# Patient Record
Sex: Male | Born: 1975 | Race: White | Hispanic: No | Marital: Married | Smoking: Never smoker
Health system: Southern US, Community
[De-identification: ages and names within clinical notes are randomized; demographics above are authoritative.]

## PROBLEM LIST (undated history)

## (undated) DIAGNOSIS — F419 Anxiety disorder, unspecified: Secondary | ICD-10-CM

## (undated) DIAGNOSIS — J302 Other seasonal allergic rhinitis: Secondary | ICD-10-CM

## (undated) DIAGNOSIS — F329 Major depressive disorder, single episode, unspecified: Secondary | ICD-10-CM

## (undated) DIAGNOSIS — F32A Depression, unspecified: Secondary | ICD-10-CM

## (undated) DIAGNOSIS — T7840XA Allergy, unspecified, initial encounter: Secondary | ICD-10-CM

## (undated) HISTORY — PX: VASECTOMY: SHX75

## (undated) HISTORY — DX: Depression, unspecified: F32.A

## (undated) HISTORY — PX: CHOLECYSTECTOMY: SHX55

## (undated) HISTORY — DX: Allergy, unspecified, initial encounter: T78.40XA

## (undated) HISTORY — PX: FRACTURE SURGERY: SHX138

---
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
## 2010-07-09 ENCOUNTER — Encounter
Admission: RE | Admit: 2010-07-09 | Discharge: 2010-07-09 | Payer: Self-pay | Source: Home / Self Care | Attending: Family Medicine | Admitting: Family Medicine

## 2010-09-01 ENCOUNTER — Encounter: Payer: Self-pay | Admitting: Emergency Medicine

## 2010-09-01 ENCOUNTER — Ambulatory Visit (INDEPENDENT_AMBULATORY_CARE_PROVIDER_SITE_OTHER): Payer: BC Managed Care – PPO | Admitting: Emergency Medicine

## 2010-09-01 DIAGNOSIS — F419 Anxiety disorder, unspecified: Secondary | ICD-10-CM | POA: Insufficient documentation

## 2010-09-01 DIAGNOSIS — F411 Generalized anxiety disorder: Secondary | ICD-10-CM | POA: Insufficient documentation

## 2010-09-01 DIAGNOSIS — L500 Allergic urticaria: Secondary | ICD-10-CM | POA: Insufficient documentation

## 2010-09-01 DIAGNOSIS — J45909 Unspecified asthma, uncomplicated: Secondary | ICD-10-CM | POA: Insufficient documentation

## 2010-09-03 ENCOUNTER — Telehealth (INDEPENDENT_AMBULATORY_CARE_PROVIDER_SITE_OTHER): Payer: Self-pay | Admitting: *Deleted

## 2010-09-11 NOTE — Assessment & Plan Note (Signed)
Summary: RED RASH,ITCHING/WSE (rm 4)   Vital Signs:  Patient Profile:   35 Years Old Male CC:      rash, itching, fever  x 4 days Height:     68 inches Weight:      214 pounds O2 Sat:      100 % O2 treatment:    Room Air Temp:     99.0 degrees F oral Pulse rate:   91 / minute Resp:     14 per minute BP sitting:   118 / 76  (left arm) Cuff size:   large  Pt. in pain?   no  Vitals Entered By: Lajean Saver RN (September 01, 2010 6:00 PM)                   Updated Prior Medication List: ALBUTEROL SULFATE (2.5 MG/3ML) 0.083% NEBU (ALBUTEROL SULFATE) prn ZYRTEC ALLERGY 10 MG CAPS (CETIRIZINE HCL)  WELLBUTRIN XL 300 MG XR24H-TAB (BUPROPION HCL)   Current Allergies: ! PHENERGAN ! * SINGULAIR * SEASONALHistory of Present Illness Chief Complaint: rash, itching, fever  x 4 days History of Present Illness: Took phenergan for the first time 4 days ago (rx'd by his pcp for N/V for dx of G-E. GI signs's resolved. The next day, developed severe, worsening pruritic rash on arms, legs, chest, and back. Not relieved with by mouth benadryl or topical caladryl.   REVIEW OF SYSTEMS Constitutional Symptoms       Complains of fever.     Denies chills, night sweats, weight loss, weight gain, and fatigue.  Eyes       Denies change in vision, eye pain, eye discharge, glasses, contact lenses, and eye surgery. Ear/Nose/Throat/Mouth       Denies hearing loss/aids, change in hearing, ear pain, ear discharge, dizziness, frequent runny nose, frequent nose bleeds, sinus problems, sore throat, hoarseness, and tooth pain or bleeding.  Respiratory       Denies dry cough, productive cough, wheezing, shortness of breath, asthma, bronchitis, and emphysema/COPD.  Cardiovascular       Denies murmurs, chest pain, and tires easily with exhertion.    Gastrointestinal       Denies stomach pain, nausea/vomiting, diarrhea, constipation, blood in bowel movements, and indigestion. Genitourniary       Denies  painful urination, kidney stones, and loss of urinary control. Neurological       Denies paralysis, seizures, and fainting/blackouts. Musculoskeletal       Denies muscle pain, joint pain, joint stiffness, decreased range of motion, redness, swelling, muscle weakness, and gout.  Skin       Denies bruising, unusual mles/lumps or sores, and hair/skin or nail changes.      Comments: itching Psych       Denies mood changes, temper/anger issues, anxiety/stress, speech problems, depression, and sleep problems. Other Comments: Patient took Phenergan for the first time on Thursday. He took a nap and once he awoke he noticed a red rash developing on several places of his body. He didnot take anymore phenergan but the rash has worsened since. he has also had a fever and felt slightly short of breath. The rash is on his arms, legs, chest, and back   Past History:  Past Medical History: Anxiety Asthma  Past Surgical History: Cholecystectomy  Family History: none  Social History: Occupation: Radio Engineer, production Married Never Smoked Alcohol use-no Drug use-no childrenSmoking Status:  never Drug Use:  no Physical Exam General appearance: uncomfortable from pruritic rash, NAD Head: normocephalic, atraumatic  Eyes: conjunctivae and lids normal Pupils: equal, round, reactive to light Ears: normal, no lesions or deformities Oral/Pharynx: tongue normal, posterior pharynx without erythema or exudate. No lip swelling. Airway intact. Neck: neck supple,  trachea midline, no masses Chest/Lungs: no rales, wheezes, or rhonchi bilateral, breath sounds equal without effort Heart: regular rate and  rhythm, no murmur Neurological: grossly intact and non-focal Skin: diffuse, blanching, morbilliform, maculopap. rash on trunck, neck and extremities. Assessment New Problems: ALLERGIC URTICARIA (ICD-708.0) ASTHMA (ICD-493.90) ANXIETY (ICD-300.00)  I suspect phenergan as cause for urticaria. In my opinion, it is  severe enough to rx with by mouth steroids. --Pt agrees.  Risks, benefits, alternatives discussed. Pt voiced understanding and agreement.   Patient Education: Patient and/or caregiver instructed in the following: fluids. other sxx care discussed  Plan New Medications/Changes: PREDNISONE (PAK) 10 MG TABS (PREDNISONE) 6 day dosepack as directed  #1 x 0, 09/01/2010, Lajean Manes MD  New Orders: Est. Patient Level III [16109] New Patient Level III [60454] Follow Up: Follow up in 2-3 days if no improvement, Follow up with Primary Physician Follow Up: sooner if worse  The patient and/or caregiver has been counseled thoroughly with regard to medications prescribed including dosage, schedule, interactions, rationale for use, and possible side effects and they verbalize understanding.  Diagnoses and expected course of recovery discussed and will return if not improved as expected or if the condition worsens. Patient and/or caregiver verbalized understanding.  Prescriptions: PREDNISONE (PAK) 10 MG TABS (PREDNISONE) 6 day dosepack as directed  #1 x 0   Entered and Authorized by:   Lajean Manes MD   Signed by:   Lajean Manes MD on 09/01/2010   Method used:   Handwritten   RxID:   [id number]   Orders Added: 1)  Est. Patient Level III [30865] 2)  New Patient Level III [78469]

## 2010-09-11 NOTE — Progress Notes (Signed)
  Phone Note Outgoing Call Call back at [facility] Phone [phone]   Call placed by: Lajean Saver RN,  September 03, 2010 2:14 PM Call placed to: Patient Action Taken: Phone Call Completed Summary of Call: Callback: Patient reports his rash is improving and he is taking the presnisone as prescribed

## 2010-10-24 ENCOUNTER — Ambulatory Visit
Admission: RE | Admit: 2010-10-24 | Discharge: 2010-10-24 | Disposition: A | Discharge status: Home / Self Care | Payer: BC Managed Care – PPO | Source: Ambulatory Visit | Attending: Internal Medicine | Admitting: Internal Medicine

## 2010-10-24 ENCOUNTER — Other Ambulatory Visit: Payer: Self-pay | Admitting: Internal Medicine

## 2010-10-24 DIAGNOSIS — J40 Bronchitis, not specified as acute or chronic: Secondary | ICD-10-CM

## 2011-07-30 ENCOUNTER — Emergency Department (INDEPENDENT_AMBULATORY_CARE_PROVIDER_SITE_OTHER)
Admission: EM | Admit: 2011-07-30 | Discharge: 2011-07-30 | Disposition: A | Discharge status: Home / Self Care | Payer: BC Managed Care – PPO | Source: Home / Self Care | Attending: Emergency Medicine | Admitting: Emergency Medicine

## 2011-07-30 ENCOUNTER — Encounter: Payer: Self-pay | Admitting: Emergency Medicine

## 2011-07-30 DIAGNOSIS — J209 Acute bronchitis, unspecified: Secondary | ICD-10-CM

## 2011-07-30 DIAGNOSIS — R05 Cough: Secondary | ICD-10-CM

## 2011-07-30 MED ORDER — AZITHROMYCIN 250 MG PO TABS: ORAL_TABLET | ORAL | Status: AC

## 2011-07-30 MED ORDER — GUAIFENESIN-CODEINE 100-10 MG/5ML PO SYRP: 5.0000 mL | ORAL_SOLUTION | Freq: Four times a day (QID) | ORAL | Status: AC | PRN

## 2011-07-30 NOTE — ED Provider Notes (Signed)
History     CSN: [id number]  Arrival date & time 07/30/11  [place] Guardian   First MD Initiated Contact with Patient 07/30/11 1855      Chief Complaint  Patient presents with  . Cough    (Consider location/radiation/quality/duration/timing/severity/associated sxs/prior treatment) HPI Donald Gillespie is a 36 y.o. male who complains of onset of cold symptoms for 2 weeks.  No sore throat + productive cough No pleuritic pain + nasal congestion + post-nasal drainage + sinus pain/pressure + chest congestion No itchy/red eyes No earache No hemoptysis + SOB from coughing No chills/sweats No fever No nausea No vomiting No abdominal pain No diarrhea No skin rashes No fatigue No myalgias No headache    History reviewed. No pertinent past medical history.  Past Surgical History  Procedure Date  . Cholecystectomy     History reviewed. No pertinent family history.  History  Substance Use Topics  . Smoking status: Not on file  . Smokeless tobacco: Not on file  . Alcohol Use:       Review of Systems  Allergies  Montelukast sodium and Promethazine hcl  Home Medications   Current Outpatient Rx  Name Route Sig Dispense Refill  . ALBUTEROL SULFATE (2.5 MG/3ML) 0.083% IN NEBU Nebulization Take 2.5 mg by nebulization every 6 (six) hours as needed.      . BUPROPION HCL ER (XL) 300 MG PO TB24 Oral Take 300 mg by mouth daily.      Marland Kitchen CETIRIZINE HCL 10 MG PO TABS Oral Take 10 mg by mouth daily.      Marland Kitchen CLONAZEPAM 0.5 MG PO TABS Oral Take 0.5 mg by mouth 2 (two) times daily as needed.        BP 119/81  Pulse 96  Temp(Src) 98.6 F (37 C) (Oral)  Resp 16  Ht 5\' 8"  (1.727 m)  Wt 211 lb (95.709 kg)  BMI 32.08 kg/m2  SpO2 99%  Physical Exam  Nursing note and vitals reviewed. Constitutional: He is oriented to person, place, and time. He appears well-developed and well-nourished.  HENT:  Head: Normocephalic and atraumatic.  Right Ear: Tympanic membrane, external ear and ear canal normal.   Left Ear: Tympanic membrane, external ear and ear canal normal.  Nose: Mucosal edema and rhinorrhea present.  Mouth/Throat: Posterior oropharyngeal erythema present. No oropharyngeal exudate or posterior oropharyngeal edema.  Eyes: No scleral icterus.  Neck: Neck supple.  Cardiovascular: Regular rhythm and normal heart sounds.   Pulmonary/Chest: Effort normal and breath sounds normal. No respiratory distress.  Neurological: He is alert and oriented to person, place, and time.  Skin: Skin is warm and dry.  Psychiatric: He has a normal mood and affect. His speech is normal.    ED Course  Procedures (including critical care time)  Labs Reviewed - No data to display No results found.   No diagnosis found.    MDM  1)  Take the prescribed antibiotic as instructed. 2)  Use nasal saline solution (over the counter) at least 3 times a day. 3)  Use over the counter decongestants like Zyrtec-D every 12 hours as needed to help with congestion.  If you have hypertension, do not take medicines with sudafed.  4)  Can take tylenol every 6 hours or motrin every 8 hours for pain or fever. 5)  Follow up with your primary doctor if no improvement in 5-7 days, sooner if increasing pain, fever, or new symptoms.   We'll will     Lily Kocher, MD 07/30/11 [phone]

## 2011-07-30 NOTE — ED Notes (Signed)
Productive cough x 2 weeks

## 2011-08-13 ENCOUNTER — Emergency Department
Admission: EM | Admit: 2011-08-13 | Discharge: 2011-08-13 | Disposition: A | Discharge status: Home / Self Care | Payer: BC Managed Care – PPO | Source: Home / Self Care

## 2011-08-13 ENCOUNTER — Encounter: Payer: Self-pay | Admitting: *Deleted

## 2011-08-13 DIAGNOSIS — S46011A Strain of muscle(s) and tendon(s) of the rotator cuff of right shoulder, initial encounter: Secondary | ICD-10-CM

## 2011-08-13 DIAGNOSIS — S43429A Sprain of unspecified rotator cuff capsule, initial encounter: Secondary | ICD-10-CM

## 2011-08-13 DIAGNOSIS — M25519 Pain in unspecified shoulder: Secondary | ICD-10-CM

## 2011-08-13 DIAGNOSIS — M25511 Pain in right shoulder: Secondary | ICD-10-CM

## 2011-08-13 HISTORY — DX: Anxiety disorder, unspecified: F41.9

## 2011-08-13 HISTORY — DX: Other seasonal allergic rhinitis: J30.2

## 2011-08-13 MED ORDER — MELOXICAM 7.5 MG PO TABS: 7.5000 mg | ORAL_TABLET | Freq: Two times a day (BID) | ORAL | Status: DC | PRN

## 2011-08-13 NOTE — ED Provider Notes (Signed)
History     CSN: [id number]  Arrival date & time 08/13/11  1647   None     Chief Complaint  Patient presents with  . Shoulder Injury    (Consider location/radiation/quality/duration/timing/severity/associated sxs/prior treatment) HPI This is a right-handed patient who comes in today with right shoulder pain for a month. He recalls trying to break up a fight between his dogs about a month ago and he does to try to stop them and landed on his arm/shoulder. He did feel some pain at that time and it has continued to linger for a month. At work he does a lot of lifting and reaching over his head and he feels that this has been continually aggravating his shoulder. He has been taking ibuprofen 800 mg which has been helping a little bit he has never injured that shoulder in the past. Certain movements make it worse, mostly reaching behind his back.  Past Medical History  Diagnosis Date  . Asthma   . Seasonal allergies   . Anxiety     Past Surgical History  Procedure Date  . Cholecystectomy     History reviewed. No pertinent family history.  History  Substance Use Topics  . Smoking status: Never Smoker   . Smokeless tobacco: Not on file  . Alcohol Use: No      Review of Systems  Allergies  Montelukast sodium and Promethazine hcl  Home Medications   Current Outpatient Rx  Name Route Sig Dispense Refill  . ALBUTEROL SULFATE (2.5 MG/3ML) 0.083% IN NEBU Nebulization Take 2.5 mg by nebulization every 6 (six) hours as needed.      . BUPROPION HCL ER (XL) 300 MG PO TB24 Oral Take 300 mg by mouth daily.      Marland Kitchen CETIRIZINE HCL 10 MG PO TABS Oral Take 10 mg by mouth daily.      Marland Kitchen CLONAZEPAM 0.5 MG PO TABS Oral Take 0.5 mg by mouth 2 (two) times daily as needed.      . MELOXICAM 7.5 MG PO TABS Oral Take 1 tablet (7.5 mg total) by mouth 2 (two) times daily as needed for pain. 30 tablet 0    BP 112/77  Pulse 95  Temp(Src) 98 F (36.7 C) (Oral)  Resp 18  Ht 5\' 8"  (1.727 m)  Wt  212 lb (96.163 kg)  BMI 32.23 kg/m2  SpO2 97%  Physical Exam  Nursing note and vitals reviewed. Constitutional: He is oriented to person, place, and time. He appears well-developed and well-nourished.  HENT:  Head: Normocephalic and atraumatic.  Eyes: No scleral icterus.  Neck: Neck supple.  Cardiovascular: Regular rhythm and normal heart sounds.   Pulmonary/Chest: Effort normal and breath sounds normal. No respiratory distress.  Musculoskeletal:       R Shoulder: Inspection reveals no abnormalities, atrophy or asymmetry.  Palpation demonstrates some tenderness around the acromion.  There is  no tenderness over AC, [place], bicipital groove, and coracoid.  ROM is full in all planes except with internal rotation which is reduced compared to the opposite side. Rotator cuff strength normal throughout. Positive Neer and Hawkin's tests, empty can.  Speeds and Yergason's tests normal. Normal scapular function, No apprehension sign.  Distal NV status intact.   Neurological: He is alert and oriented to person, place, and time.  Skin: Skin is warm and dry.  Psychiatric: He has a normal mood and affect. His speech is normal.    ED Course  Procedures (including critical care time)  Labs Reviewed -  No data to display No results found.   1. Right shoulder pain   2. Strain of tendon of right rotator cuff       MDM   I have referred him to sports medicine for a likely rotator cuff strain of the supraspinatus and infraspinatus. He will likely need a referral to physical therapy for rotator cuff strengthening and scapular exercises and other conservative management for rotator cuff strain including possible cortisone injection. I have not x-rayed him today since a sports medicine doctor that he goes to we'll likely want to do that themselves.  Lily Kocher, MD 08/13/11 [phone]

## 2011-08-13 NOTE — ED Notes (Signed)
Pt c/o shoulder injury x 1 mth ago. He has taken IBF with no relief.

## 2011-08-28 ENCOUNTER — Ambulatory Visit (INDEPENDENT_AMBULATORY_CARE_PROVIDER_SITE_OTHER): Payer: BC Managed Care – PPO | Admitting: Family Medicine

## 2011-08-28 ENCOUNTER — Encounter: Payer: Self-pay | Admitting: Family Medicine

## 2011-08-28 VITALS — BP 120/84 | HR 88 | Temp 97.9°F | Ht 68.0 in | Wt 210.0 lb

## 2011-08-28 DIAGNOSIS — M25519 Pain in unspecified shoulder: Secondary | ICD-10-CM

## 2011-08-28 DIAGNOSIS — M25511 Pain in right shoulder: Secondary | ICD-10-CM

## 2011-08-28 MED ORDER — MELOXICAM 15 MG PO TABS: 15.0000 mg | ORAL_TABLET | Freq: Every day | ORAL | Status: AC

## 2011-08-28 NOTE — Progress Notes (Signed)
Subjective:    Patient ID: Donald Gillespie, male    DOB: [birth year], 36 y.o.   MRN: [id number]  PCP: Dr. Lebron Conners  HPI 36 yo M here for right shoulder injury.  Patient reports on 07/18/11 he recalls trying to stop two dogs from fighting - reached forward to grab one from the back and missed, fell forward sustaining FOOSH injury to right upper extremity. Immediate pain in right shoulder. No swelling or bruising. Since then has had trouble with reaching behind, overhead mostly. No night pain. History of right shoulder separation remotely. Right handed. Taking mobic which helps some (previously took ibuprofen). Not doing any exercises.  Past Medical History  Diagnosis Date  . Asthma   . Seasonal allergies   . Anxiety     Current Outpatient Prescriptions on File Prior to Visit  Medication Sig Dispense Refill  . albuterol (PROVENTIL) (2.5 MG/3ML) 0.083% nebulizer solution Take 2.5 mg by nebulization every 6 (six) hours as needed.        Marland Kitchen buPROPion (WELLBUTRIN XL) 300 MG 24 hr tablet Take 300 mg by mouth daily.        . cetirizine (ZYRTEC) 10 MG tablet Take 10 mg by mouth daily.        . clonazePAM (KLONOPIN) 0.5 MG tablet Take 0.5 mg by mouth 2 (two) times daily as needed.          Past Surgical History  Procedure Date  . Cholecystectomy     Allergies  Allergen Reactions  . Montelukast Sodium   . Promethazine Hcl     History   Social History  . Marital Status: Married    Spouse Name: N/A    Number of Children: N/A  . Years of Education: N/A   Occupational History  . Not on file.   Social History Main Topics  . Smoking status: Never Smoker   . Smokeless tobacco: Not on file  . Alcohol Use: No  . Drug Use: No  . Sexually Active: Not on file   Other Topics Concern  . Not on file   Social History Narrative  . No narrative on file    Family History  Problem Relation Age of Onset  . Hypertension Mother   . Diabetes Neg Hx   . Heart attack Neg Hx   .  Hyperlipidemia Neg Hx     BP 120/84  Pulse 88  Temp(Src) 97.9 F (36.6 C) (Oral)  Ht 5\' 8"  (1.727 m)  Wt 210 lb (95.255 kg)  BMI 31.93 kg/m2  Review of Systems See HPI above.    Objective:   Physical Exam Gen: NAD  R shoulder: No swelling, ecchymoses.  No gross deformity. No TTP AC, biceps tendon.  Mild TTP infraspinatus muscle. FROM with mild painful arc. Positive Hawkins, Neers. Negative Speeds, Yergasons. Negative crossover adduction. 5/5 strength with empty can and resisted internal/external rotation.  Pain with empty can. Negative apprehension. NV intact distally.  MSK u/s R shoulder: Biceps tendon intact on long and transverse views.  AC joint normal.  No impingement of subscap under coracoid.  Subscapularis, infraspinatus, and supraspinatus muscles intact on long and trans views.    Assessment & Plan:  1. Right shoulder pain - 2/2 rotator cuff strain - msk u/s reassuring.  Exam also reassuring with excellent strength.  Start PT and HEP.  Continue mobic daily with food.  Avoid painful activities - work restrictions noted (no overhead lifting, no lifting more than 15 pounds).  Icing as needed.  If  not improving over next month will consider cortisone injection, nitro patches. See instructions for further.

## 2011-08-28 NOTE — Patient Instructions (Signed)
You have a right rotator cuff strain from the injury 6 weeks ago. Your ultrasound shows you do not have a rotator cuff tear which is excellent. Try to avoid painful activities (overhead activities, lifting with extended arm) as much as possible. Meloxicam 15mg  daily with food daily for pain and inflammation. Subacromial injection may be beneficial to help with pain and to decrease inflammation - typically go ahead with this if you're still struggling with physical therapy. Start physical therapy and transition to a home exercise program. Make sure to do home exercises once a day on days you do not go to therapy. Follow up with me in 1 month for a reevaluation.

## 2011-08-28 NOTE — Assessment & Plan Note (Signed)
2/2 rotator cuff strain - msk u/s reassuring.  Exam also reassuring with excellent strength.  Start PT and HEP.  Continue mobic daily with food.  Avoid painful activities - work restrictions noted (no overhead lifting, no lifting more than 15 pounds).  Icing as needed.  If not improving over next month will consider cortisone injection, nitro patches. See instructions for further.

## 2011-09-01 ENCOUNTER — Ambulatory Visit: Payer: BC Managed Care – PPO | Attending: Family Medicine | Admitting: Physical Therapy

## 2011-09-01 DIAGNOSIS — IMO0001 Reserved for inherently not codable concepts without codable children: Secondary | ICD-10-CM | POA: Insufficient documentation

## 2011-09-01 DIAGNOSIS — M6281 Muscle weakness (generalized): Secondary | ICD-10-CM | POA: Insufficient documentation

## 2011-09-01 DIAGNOSIS — M25569 Pain in unspecified knee: Secondary | ICD-10-CM | POA: Insufficient documentation

## 2011-09-02 ENCOUNTER — Ambulatory Visit: Payer: BC Managed Care – PPO | Admitting: Physical Therapy

## 2011-09-09 ENCOUNTER — Ambulatory Visit: Payer: BC Managed Care – PPO | Admitting: Physical Therapy

## 2011-09-10 ENCOUNTER — Ambulatory Visit: Payer: BC Managed Care – PPO | Admitting: Physical Therapy

## 2011-09-16 ENCOUNTER — Encounter: Payer: BC Managed Care – PPO | Admitting: Physical Therapy

## 2011-09-17 ENCOUNTER — Ambulatory Visit: Payer: BC Managed Care – PPO | Admitting: Physical Therapy

## 2011-09-24 ENCOUNTER — Ambulatory Visit: Payer: BC Managed Care – PPO | Admitting: Physical Therapy

## 2012-03-02 ENCOUNTER — Emergency Department
Admission: EM | Admit: 2012-03-02 | Discharge: 2012-03-02 | Disposition: A | Discharge status: Home / Self Care | Payer: BC Managed Care – PPO | Source: Home / Self Care | Attending: Family Medicine | Admitting: Family Medicine

## 2012-03-02 DIAGNOSIS — M940 Chondrocostal junction syndrome [Tietze]: Secondary | ICD-10-CM

## 2012-03-02 DIAGNOSIS — J069 Acute upper respiratory infection, unspecified: Secondary | ICD-10-CM

## 2012-03-02 MED ORDER — PREDNISONE 20 MG PO TABS: 20.0000 mg | ORAL_TABLET | Freq: Two times a day (BID) | ORAL | Status: DC

## 2012-03-02 MED ORDER — ALBUTEROL SULFATE HFA 108 (90 BASE) MCG/ACT IN AERS: 2.0000 | INHALATION_SPRAY | Freq: Four times a day (QID) | RESPIRATORY_TRACT | Status: DC | PRN

## 2012-03-02 MED ORDER — BENZONATATE 200 MG PO CAPS: 200.0000 mg | ORAL_CAPSULE | Freq: Every day | ORAL | Status: AC

## 2012-03-02 NOTE — ED Notes (Signed)
Donald Gillespie complains of trouble breathing and a dry cough for 1 week. He has a history of asthma.

## 2012-03-02 NOTE — ED Provider Notes (Signed)
History     CSN: [id number]  Arrival date & time 03/02/12  1730   First MD Initiated Contact with Patient 03/02/12 1805      Chief Complaint  Patient presents with  . Cough    1 week  . Asthma      HPI Comments: Patient complains of developing tightness in his anterior chest one week ago, but no definite shortness of breath.  He has a history of exercise asthma and began using his wife's albuterol inhaler without improvement.  He developed a mild non-productive cough two days ago, and noticed mild sinus congestion today.  Yesterday he noticed mild soreness in his anterior neck.  No fevers, chills, and sweats   The history is provided by the patient.    Past Medical History  Diagnosis Date  . Asthma   . Seasonal allergies   . Anxiety     Past Surgical History  Procedure Date  . Cholecystectomy     Family History  Problem Relation Age of Onset  . Hypertension Mother   . Diabetes Neg Hx   . Heart attack Neg Hx   . Hyperlipidemia Neg Hx     History  Substance Use Topics  . Smoking status: Never Smoker   . Smokeless tobacco: Not on file  . Alcohol Use: No      Review of Systems No sore throat, but has felt soreness in anterior neck + cough No pleuritic pain, but feels tight in anterior chest. No wheezing + nasal congestion today ? post-nasal drainage No sinus pain/pressure No itchy/red eyes No earache No hemoptysis No SOB No fever/chills No nausea No vomiting No abdominal pain No diarrhea No urinary symptoms + skin rash on lower abdomen 3 days ago + fatigue No myalgias No headache Used OTC meds without relief  Allergies  Montelukast sodium and Promethazine hcl  Home Medications   Current Outpatient Rx  Name Route Sig Dispense Refill  . ALBUTEROL SULFATE (2.5 MG/3ML) 0.083% IN NEBU Nebulization Take 2.5 mg by nebulization every 6 (six) hours as needed.      . BUPROPION HCL ER (XL) 300 MG PO TB24 Oral Take 300 mg by mouth daily.      Marland Kitchen  CETIRIZINE HCL 10 MG PO TABS Oral Take 10 mg by mouth daily.      Marland Kitchen FLUTICASONE-SALMETEROL 250-50 MCG/DOSE IN AEPB Inhalation Inhale 1 puff into the lungs every 12 (twelve) hours.    . ALBUTEROL SULFATE HFA 108 (90 BASE) MCG/ACT IN AERS Inhalation Inhale 2 puffs into the lungs every 6 (six) hours as needed for wheezing. 1 Inhaler 0  . BENZONATATE 200 MG PO CAPS Oral Take 1 capsule (200 mg total) by mouth at bedtime. Take as needed for cough 12 capsule 0  . CLONAZEPAM 0.5 MG PO TABS Oral Take 0.5 mg by mouth 2 (two) times daily as needed.      . MELOXICAM 15 MG PO TABS Oral Take 1 tablet (15 mg total) by mouth daily. 30 tablet 0  . PREDNISONE 20 MG PO TABS Oral Take 1 tablet (20 mg total) by mouth 2 (two) times daily. Take with food. 10 tablet 0    BP 133/82  Pulse 87  Temp 97.6 F (36.4 C) (Oral)  Resp 18  Ht 5\' 8"  (1.727 m)  Wt 213 lb (96.616 kg)  BMI 32.39 kg/m2  SpO2 100%  Physical Exam Nursing notes and Vital Signs reviewed. Appearance:  Patient appears healthy, stated age, and in no acute  distress Eyes:  Pupils are equal, round, and reactive to light and accomodation.  Extraocular movement is intact.  Conjunctivae are not inflamed  Ears:  Canals normal.  Tympanic membranes normal.  Nose:  Mildly congested turbinates.  No sinus tenderness.   Pharynx:  Normal Neck:  Supple.  Slightly tender shotty posterior nodes are palpated bilaterally  Lungs:  Clear to auscultation.  Breath sounds are equal. Chest:  Distinct tenderness to palpation over the mid-sternum.   Heart:  Regular rate and rhythm without murmurs, rubs, or gallops.  Abdomen:  Nontender without masses or hepatosplenomegaly.  Bowel sounds are present.  No CVA or flank tenderness.  Extremities:  No edema.  No calf tenderness Skin:  No rash present.   ED Course  Procedures none      1. Acute upper respiratory infections of unspecified site; early viral URI   2. Costochondritis, acute       MDM  There is no  evidence of bacterial infection today.   Prednisone burst.  Rx for refill albuterol MDI. Prescription written for Benzonatate [facility]) to take at bedtime for night-time cough.  Take Mucinex D (guaifenesin with decongestant) twice daily for congestion.  Increase fluid intake, rest. May use Afrin nasal spray (or generic oxymetazoline) twice daily for about 5 days.  Also recommend using saline nasal spray several times daily and saline nasal irrigation (AYR is a common brand) Stop all antihistamines for now, and other non-prescription cough/cold preparations. Follow-up with family doctor if not improving about one week        Lattie Haw, MD 03/02/12 1901

## 2012-03-04 ENCOUNTER — Telehealth: Payer: Self-pay | Admitting: Emergency Medicine

## 2012-03-06 ENCOUNTER — Emergency Department (INDEPENDENT_AMBULATORY_CARE_PROVIDER_SITE_OTHER): Payer: BC Managed Care – PPO

## 2012-03-06 ENCOUNTER — Emergency Department
Admission: EM | Admit: 2012-03-06 | Discharge: 2012-03-06 | Disposition: A | Discharge status: Home / Self Care | Payer: BC Managed Care – PPO | Source: Home / Self Care

## 2012-03-06 DIAGNOSIS — J40 Bronchitis, not specified as acute or chronic: Secondary | ICD-10-CM

## 2012-03-06 DIAGNOSIS — M940 Chondrocostal junction syndrome [Tietze]: Secondary | ICD-10-CM

## 2012-03-06 DIAGNOSIS — R071 Chest pain on breathing: Secondary | ICD-10-CM

## 2012-03-06 MED ORDER — PREDNISONE 50 MG PO TABS: 50.0000 mg | ORAL_TABLET | Freq: Every day | ORAL | Status: AC

## 2012-03-06 MED ORDER — AZITHROMYCIN 250 MG PO TABS: ORAL_TABLET | ORAL | Status: AC

## 2012-03-06 NOTE — ED Provider Notes (Signed)
History     CSN: [id number]  Arrival date & time 03/06/12  1511   First MD Initiated Contact with Patient 03/06/12 1512      No chief complaint on file.   HPI URI Symptoms Onset: 7-10 days  Description: rhinorrhea, nasal congestion, cough, pleuritic chest pain  Modifying factors:  Pt was seen for similar sxs on 03/02/12. Pt was placed on prednisone and albuterol in setting of underlying asthmatic disease. Working dx of viral URI induced asthma exacerbation. Pt states that predominant issue has been pleuritic chest pain since last clinical visit. Pt was unable to get albuterol rx filled, but has been using albuterol neb prn. No significant wheezing. No fevers, chills. Pt has been compliant with prednisone.    Symptoms Nasal discharge: mild Fever: no Sore throat: no Cough: yes Wheezing: no Ear pain: no GI symptoms: no Sick contacts: no  Red Flags  Stiff neck: no Dyspnea: no Rash: no Swallowing difficulty: no  Sinusitis Risk Factors Headache/face pain: no Double sickening: no tooth pain: no  Allergy Risk Factors Sneezing: no Itchy scratchy throat: no Seasonal symptoms: no  Flu Risk Factors Headache: no muscle aches: no severe fatigue: no   Past Medical History  Diagnosis Date  . Asthma   . Seasonal allergies   . Anxiety     Past Surgical History  Procedure Date  . Cholecystectomy     Family History  Problem Relation Age of Onset  . Hypertension Mother   . Diabetes Neg Hx   . Heart attack Neg Hx   . Hyperlipidemia Neg Hx     History  Substance Use Topics  . Smoking status: Never Smoker   . Smokeless tobacco: Not on file  . Alcohol Use: No      Review of Systems  All other systems reviewed and are negative.    Allergies  Montelukast sodium and Promethazine hcl  Home Medications   Current Outpatient Rx  Name Route Sig Dispense Refill  . ALBUTEROL SULFATE HFA 108 (90 BASE) MCG/ACT IN AERS Inhalation Inhale 2 puffs into the lungs every  6 (six) hours as needed for wheezing. 1 Inhaler 0  . ALBUTEROL SULFATE (2.5 MG/3ML) 0.083% IN NEBU Nebulization Take 2.5 mg by nebulization every 6 (six) hours as needed.      . AZITHROMYCIN 250 MG PO TABS  Take 2 tabs PO x 1 dose, then 1 tab PO QD x 4 days 6 tablet 0  . BENZONATATE 200 MG PO CAPS Oral Take 1 capsule (200 mg total) by mouth at bedtime. Take as needed for cough 12 capsule 0  . BUPROPION HCL ER (XL) 300 MG PO TB24 Oral Take 300 mg by mouth daily.      Marland Kitchen CETIRIZINE HCL 10 MG PO TABS Oral Take 10 mg by mouth daily.      Marland Kitchen CLONAZEPAM 0.5 MG PO TABS Oral Take 0.5 mg by mouth 2 (two) times daily as needed.      Marland Kitchen FLUTICASONE-SALMETEROL 250-50 MCG/DOSE IN AEPB Inhalation Inhale 1 puff into the lungs every 12 (twelve) hours.    . MELOXICAM 15 MG PO TABS Oral Take 1 tablet (15 mg total) by mouth daily. 30 tablet 0  . PREDNISONE 50 MG PO TABS Oral Take 1 tablet (50 mg total) by mouth daily. Take with food. 5 tablet 0    BP 110/74  Pulse 79  Temp 98 F (36.7 C) (Oral)  Resp 20  Ht 5\' 8"  (1.727 m)  Wt 212 lb  4 oz (96.276 kg)  BMI 32.27 kg/m2  SpO2 98%  Physical Exam  Constitutional: He is oriented to person, place, and time. He appears well-developed and well-nourished.  HENT:  Head: Normocephalic and atraumatic.  Right Ear: External ear normal.  Left Ear: External ear normal.       +nasal erythema, rhinorrhea bilaterally, + post oropharyngeal erythema     Eyes: Conjunctivae are normal.  Neck: Normal range of motion.  Cardiovascular: Normal rate and regular rhythm.   Pulmonary/Chest: Effort normal and breath sounds normal. No respiratory distress. He has no wheezes.       + anterior chest wall TTP   Abdominal: Soft.  Musculoskeletal: Normal range of motion.  Lymphadenopathy:    He has no cervical adenopathy.  Neurological: He is alert and oriented to person, place, and time.  Skin: Skin is warm.    ED Course  Procedures (including critical care time)  Labs Reviewed -  No data to display Dg Chest 2 View  03/06/2012  *RADIOLOGY REPORT*  Clinical Data: Pleuritic chest pain.  CHEST - 2 VIEW  Comparison: 10/24/2010  Findings: Cardiac and mediastinal contours appear normal.  The lungs appear clear.  No pleural effusion is identified.  Incidental azygos fissure noted.  IMPRESSION:  1.  No significant abnormality identified.  Original Report Authenticated By: Dellia Cloud, M.D.     1. Bronchitis   2. Costochondritis       MDM  Noted bronchitic changes on CXR.  Will extend prednisone course and start on azithromycin for atypical coverage in setting of underlying asthmatic disease.  Discussed use of tylenol for costochondritis (relative contraindication to NSAIDs with concominant steroid use).  Discussed general infectious and respiratory red flags.  Handout given.  Follow up as needed.     The patient and/or caregiver has been counseled thoroughly with regard to treatment plan and/or medications prescribed including dosage, schedule, interactions, rationale for use, and possible side effects and they verbalize understanding. Diagnoses and expected course of recovery discussed and will return if not improved as expected or if the condition worsens. Patient and/or caregiver verbalized understanding.              Floydene Flock, MD 03/06/12 [phone]

## 2012-03-06 NOTE — ED Notes (Signed)
Seen here on Wed with same symptoms without improvement, have one dose of prednisone left. Pain in chest with deep breaths

## 2012-03-09 NOTE — ED Provider Notes (Signed)
Agree with exam, assessment, and plan.   Lattie Haw, MD 03/09/12 [phone]

## 2012-12-28 ENCOUNTER — Encounter: Payer: Self-pay | Admitting: Emergency Medicine

## 2012-12-28 ENCOUNTER — Emergency Department
Admission: EM | Admit: 2012-12-28 | Discharge: 2012-12-28 | Disposition: A | Discharge status: Home / Self Care | Payer: BC Managed Care – PPO | Source: Home / Self Care | Attending: Family Medicine | Admitting: Family Medicine

## 2012-12-28 DIAGNOSIS — J069 Acute upper respiratory infection, unspecified: Secondary | ICD-10-CM

## 2012-12-28 MED ORDER — PREDNISONE 20 MG PO TABS: 20.0000 mg | ORAL_TABLET | Freq: Two times a day (BID) | ORAL | Status: DC

## 2012-12-28 MED ORDER — BENZONATATE 200 MG PO CAPS: 200.0000 mg | ORAL_CAPSULE | Freq: Every day | ORAL | Status: DC

## 2012-12-28 MED ORDER — AZITHROMYCIN 250 MG PO TABS: ORAL_TABLET | ORAL | Status: DC

## 2012-12-28 NOTE — ED Provider Notes (Signed)
History     CSN: [id number]  Arrival date & time 12/28/12  8657   First MD Initiated Contact with Patient 12/28/12 [phone]      Chief Complaint  Patient presents with  . Cough       HPI Comments: Patient has a history of exercise asthma, and normally only needs his albuterol inhaler rarely.  About 10 days ago he developed URI symptoms beginning with a mild sore throat (now improved), followed by progressive nasal congestion.  A cough started about 9 days ago.  Complains of fatigue but no myalgias.  Cough is now worse at night and generally non-productive during the day.  There has been no pleuritic pain or shortness of breath but he has started to wheeze over the past two days and is using albuterol more frequently.  No recent fevers, chills, and sweats   The history is provided by the patient.    Past Medical History  Diagnosis Date  . Asthma   . Seasonal allergies   . Anxiety     Past Surgical History  Procedure Laterality Date  . Cholecystectomy      Family History  Problem Relation Age of Onset  . Hypertension Mother   . Diabetes Neg Hx   . Heart attack Neg Hx   . Hyperlipidemia Neg Hx     History  Substance Use Topics  . Smoking status: Never Smoker   . Smokeless tobacco: Not on file  . Alcohol Use: No      Review of Systems + sore throat, resolved + cough No pleuritic pain + wheezing + nasal congestion + post-nasal drainage No sinus pain/pressure No itchy/red eyes No earache No hemoptysis No SOB No fever, + chills initially No nausea No vomiting No abdominal pain No diarrhea No urinary symptoms No skin rashes + fatigue No myalgias No headache Used OTC meds without relief  Allergies  Montelukast sodium and Promethazine hcl  Home Medications   Current Outpatient Rx  Name  Route  Sig  Dispense  Refill  . albuterol (PROVENTIL HFA;VENTOLIN HFA) 108 (90 BASE) MCG/ACT inhaler   Inhalation   Inhale 2 puffs into the lungs every 6 (six) hours as  needed for wheezing.   1 Inhaler   0   . albuterol (PROVENTIL) (2.5 MG/3ML) 0.083% nebulizer solution   Nebulization   Take 2.5 mg by nebulization every 6 (six) hours as needed.           Marland Kitchen azithromycin (ZITHROMAX Z-PAK) 250 MG tablet      Take 2 tabs today; then begin one tab once daily for 4 more days. (Rx void after 01/05/13)   6 each   0   . benzonatate (TESSALON) 200 MG capsule   Oral   Take 1 capsule (200 mg total) by mouth at bedtime.   12 capsule   0   . buPROPion (WELLBUTRIN XL) 300 MG 24 hr tablet   Oral   Take 300 mg by mouth daily.           . cetirizine (ZYRTEC) 10 MG tablet   Oral   Take 10 mg by mouth daily.           . clonazePAM (KLONOPIN) 0.5 MG tablet   Oral   Take 0.5 mg by mouth 2 (two) times daily as needed.           . Fluticasone-Salmeterol (ADVAIR) 250-50 MCG/DOSE AEPB   Inhalation   Inhale 1 puff into the lungs every 12 (  twelve) hours.         . predniSONE (DELTASONE) 20 MG tablet   Oral   Take 1 tablet (20 mg total) by mouth 2 (two) times daily. Take with food.   10 tablet   0     BP 117/68  Pulse 75  Temp(Src) 97.6 F (36.4 C) (Oral)  Resp 18  Ht 5\' 8"  (1.727 m)  Wt 203 lb (92.08 kg)  BMI 30.87 kg/m2  SpO2 98%  Physical Exam Nursing notes and Vital Signs reviewed. Appearance:  Patient appears healthy, stated age, and in no acute distress Eyes:  Pupils are equal, round, and reactive to light and accomodation.  Extraocular movement is intact.  Conjunctivae are not inflamed  Ears:  Canals normal.  Tympanic membranes normal.  Nose:  Mildly congested turbinates.  No sinus tenderness.    Pharynx:  Normal Neck:  Supple.   Tender shotty posterior nodes are palpated bilaterally  Lungs:  Clear to auscultation.  Breath sounds are equal.  Heart:  Regular rate and rhythm without murmurs, rubs, or gallops.  Abdomen:  Nontender without masses or hepatosplenomegaly.  Bowel sounds are present.  No CVA or flank tenderness.   Extremities:  No edema.  No calf tenderness Skin:  No rash present.   ED Course  Procedures  none      1. Acute upper respiratory infections of unspecified site; viral URI with bronchospasm       MDM  There is no evidence of bacterial infection today.  Begin prednisone burst.  Prescription written for Benzonatate (Tessalon) to take at bedtime for night-time cough.  Take plain Mucinex (guaifenesin) twice daily for cough and congestion.  May add Sudafed for sinus congestion as needed.  Increase fluid intake, rest. May use Afrin nasal spray (or generic oxymetazoline) twice daily for about 5 days.  Also recommend using saline nasal spray several times daily and saline nasal irrigation (AYR is a common brand) Stop all antihistamines for now, and other non-prescription cough/cold preparations. Continue albuterol inhaler as needed. Begin Azithromycin if not improving about 5 days or if persistent fever develops. Follow-up with family doctor if not improving 7 to 10 days.  Recommend follow-up with a PCP to establish asthma plan        Lattie Haw, MD 12/28/12 [phone]

## 2012-12-28 NOTE — ED Notes (Signed)
Congestion, productive cough, yellow, brown x 1 week, taking mucinex for 1 week

## 2013-05-04 ENCOUNTER — Other Ambulatory Visit (HOSPITAL_COMMUNITY): Payer: Self-pay | Admitting: Psychiatry

## 2017-11-22 ENCOUNTER — Encounter: Payer: Self-pay | Admitting: Emergency Medicine

## 2017-11-22 ENCOUNTER — Emergency Department
Admission: EM | Admit: 2017-11-22 | Discharge: 2017-11-22 | Disposition: A | Discharge status: Home / Self Care | Payer: BLUE CROSS/BLUE SHIELD | Source: Home / Self Care

## 2017-11-22 DIAGNOSIS — J45901 Unspecified asthma with (acute) exacerbation: Secondary | ICD-10-CM

## 2017-11-22 DIAGNOSIS — J209 Acute bronchitis, unspecified: Secondary | ICD-10-CM

## 2017-11-22 MED ORDER — PREDNISONE 50 MG PO TABS: 50.0000 mg | ORAL_TABLET | ORAL | 0 refills | Status: AC

## 2017-11-22 MED ORDER — IPRATROPIUM-ALBUTEROL 0.5-2.5 (3) MG/3ML IN SOLN
3.0000 mL | Freq: Four times a day (QID) | RESPIRATORY_TRACT | Status: DC
  Administered 2017-11-22: 3 mL via RESPIRATORY_TRACT

## 2017-11-22 MED ORDER — AZITHROMYCIN 250 MG PO TABS: ORAL_TABLET | ORAL | 0 refills | Status: DC

## 2017-11-22 NOTE — ED Triage Notes (Signed)
Pt c/o dizziness, ear pain and cough x4 days. Afebrile.

## 2017-11-22 NOTE — Discharge Instructions (Addendum)
Return if any problems.

## 2017-11-22 NOTE — ED Provider Notes (Signed)
[facility]    CSN: [id number] Arrival date & time: 11/22/17  0806     History   Chief Complaint Chief Complaint  Patient presents with  . Cough    HPI Donald Gillespie is a 42 y.o. male.   The history is provided by the patient. No language interpreter was used.  Cough  Cough characteristics:  Productive Sputum characteristics:  Nondescript Severity:  Moderate Onset quality:  Gradual Duration:  4 days Timing:  Constant Progression:  Worsening Chronicity:  New Smoker: no   Relieved by:  Beta-agonist inhaler Worsened by:  Nothing Ineffective treatments:  Beta-agonist inhaler Associated symptoms: wheezing   Risk factors: no recent infection     Past Medical History:  Diagnosis Date  . Anxiety   . Asthma   . Seasonal allergies     Patient Active Problem List   Diagnosis Date Noted  . Right shoulder pain 08/28/2011  . ANXIETY 09/01/2010  . ASTHMA 09/01/2010  . 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 CHOLECYSTECTOMY         Home Medications    Prior to Admission medications   Medication Sig Start Date End Date Taking? Authorizing Provider  albuterol (PROVENTIL HFA;VENTOLIN HFA) 108 (90 BASE) MCG/ACT inhaler Inhale 2 puffs into the lungs every 6 (six) hours as needed for wheezing. 03/02/12 03/02/13  Lattie Haw, MD  albuterol (PROVENTIL) (2.5 MG/3ML) 0.083% nebulizer solution Take 2.5 mg by nebulization every 6 (six) hours as needed.      [provider]  azithromycin (ZITHROMAX Z-PAK) 250 MG tablet Take 2 tabs today; then begin one tab once daily for 4 more days. (Rx void after 01/05/13) 12/28/12   Lattie Haw, MD  benzonatate (TESSALON) 200 MG capsule Take 1 capsule (200 mg total) by mouth at bedtime. 12/28/12   Lattie Haw, MD  buPROPion (WELLBUTRIN XL) 300 MG 24 hr tablet Take 300 mg by mouth daily.      [provider]  cetirizine (ZYRTEC) 10 MG tablet Take 10 mg by  mouth daily.      [provider]  clonazePAM (KLONOPIN) 0.5 MG tablet Take 0.5 mg by mouth 2 (two) times daily as needed.      [provider]  Fluticasone-Salmeterol (ADVAIR) 250-50 MCG/DOSE AEPB Inhale 1 puff into the lungs every 12 (twelve) hours.    [provider]  predniSONE (DELTASONE) 20 MG tablet Take 1 tablet (20 mg total) by mouth 2 (two) times daily. Take with food. 12/28/12   Lattie Haw, MD    Family History Family History  Problem Relation Age of Onset  . Hypertension Mother   . Diabetes Neg Hx   . Heart attack Neg Hx   . Hyperlipidemia Neg Hx     Social History Social History   Tobacco Use  . Smoking status: Never Smoker  . Smokeless tobacco: Never Used  Substance Use Topics  . Alcohol use: No  . Drug use: No     Allergies   Montelukast sodium and Promethazine hcl   Review of Systems Review of Systems  Respiratory: Positive for cough and wheezing.      Physical Exam Triage Vital Signs ED Triage Vitals  Enc Vitals Group     BP 11/22/17 0827 122/78     Pulse Rate 11/22/17 0827 80     Resp --      Temp 11/22/17 0827 97.6 F (36.4 C)  Temp Source 11/22/17 0827 Oral     SpO2 11/22/17 0827 99 %     Weight 11/22/17 0828 188 lb (85.3 kg)     Height --      Head Circumference --      Peak Flow --      Pain Score 11/22/17 0828 0     Pain Loc --      Pain Edu? --      Excl. in GC? --    No data found.  Updated Vital Signs BP 122/78 (BP Location: Right Arm)   Pulse 80   Temp 97.6 F (36.4 C) (Oral)   Wt 188 lb (85.3 kg)   SpO2 99%   BMI 28.59 kg/m   Visual Acuity Right Eye Distance:   Left Eye Distance:   Bilateral Distance:    Right Eye Near:   Left Eye Near:    Bilateral Near:     Physical Exam  Constitutional: He appears well-developed and well-nourished.  HENT:  Head: Normocephalic and atraumatic.  Eyes: Conjunctivae are normal.  Neck: Neck supple.  Cardiovascular: Normal rate and regular  rhythm.  No murmur heard. Pulmonary/Chest: Effort normal. No respiratory distress. He has wheezes.  Abdominal: Soft. There is no tenderness.  Musculoskeletal: He exhibits no edema.  Neurological: He is alert.  Skin: Skin is warm and dry.  Psychiatric: He has a normal mood and affect.  Nursing note and vitals reviewed.    UC Treatments / Results  Labs (all labs ordered are listed, but only abnormal results are displayed) Labs Reviewed - No data to display  EKG None Radiology No results found.  Procedures Procedures (including critical care time)  Medications Ordered in UC Medications - No data to display   Initial Impression / Assessment and Plan / UC Course  I have reviewed the triage vital signs and the nursing notes.  Pertinent labs & imaging results that were available during my care of the patient were reviewed by me and considered in my medical decision making (see chart for details).     Pt given dulneb,  Rx for prednisone and zithromax.  Pt advised to see his MD for recheck if symptoms persist  Final Clinical Impressions(s) / UC Diagnoses   Final diagnoses:  Moderate asthma with exacerbation, unspecified whether persistent  Acute bronchitis, unspecified organism    ED Discharge Orders        Ordered    azithromycin (ZITHROMAX Z-PAK) 250 MG tablet     11/22/17 0850    predniSONE (DELTASONE) 50 MG tablet  1 Day/Dose     11/22/17 0850       Controlled Substance Prescriptions [place] Controlled Substance Registry consulted? Not Applicable   [place]son [place], [place] 11/22/17 1324

## 2018-06-24 ENCOUNTER — Emergency Department (INDEPENDENT_AMBULATORY_CARE_PROVIDER_SITE_OTHER)
Admission: EM | Admit: 2018-06-24 | Discharge: 2018-06-24 | Disposition: A | Discharge status: Home / Self Care | Payer: BLUE CROSS/BLUE SHIELD | Source: Home / Self Care | Attending: Family Medicine | Admitting: Family Medicine

## 2018-06-24 ENCOUNTER — Other Ambulatory Visit: Payer: Self-pay

## 2018-06-24 DIAGNOSIS — B9789 Other viral agents as the cause of diseases classified elsewhere: Secondary | ICD-10-CM

## 2018-06-24 DIAGNOSIS — J069 Acute upper respiratory infection, unspecified: Secondary | ICD-10-CM

## 2018-06-24 DIAGNOSIS — J4521 Mild intermittent asthma with (acute) exacerbation: Secondary | ICD-10-CM

## 2018-06-24 MED ORDER — AZITHROMYCIN 250 MG PO TABS: 250.0000 mg | ORAL_TABLET | Freq: Every day | ORAL | 0 refills | Status: DC

## 2018-06-24 MED ORDER — BENZONATATE 100 MG PO CAPS: 100.0000 mg | ORAL_CAPSULE | Freq: Three times a day (TID) | ORAL | 0 refills | Status: DC

## 2018-06-24 MED ORDER — PREDNISONE 20 MG PO TABS: ORAL_TABLET | ORAL | 0 refills | Status: DC

## 2018-06-24 NOTE — Discharge Instructions (Signed)
°  Your symptoms are likely due to a virus such as the common cold, however, if you developing worsening chest congestion with shortness of breath, persistent fever (> 100.4*F) for 3 days, or symptoms not improving in 4-5 days, you may fill the antibiotic (azithromycin).  If you do fill the antibiotic,  please take antibiotics as prescribed and be sure to complete entire course even if you start to feel better to ensure infection does not come back. ° °

## 2018-06-24 NOTE — ED Provider Notes (Signed)
[name]    CSN: [id number] Arrival date & time: 06/24/18  0948     History   Chief Complaint Chief Complaint  Patient presents with  . Cough    HPI Arlie Riker is a 42 y.o. male.   HPI Derin Granquist is a 42 y.o. male presenting to UC with hx of asthma c/o 1 week of mildly productive cough with yellow phlegm, sore throat, mild intermittent wheezing and SOB that has improved since last night.  He has taken Nyquil and Robitussin with moderate relief. He is also using his albuterol inhaler. Denies fever, chills, n/v/d.    Past Medical History:  Diagnosis Date  . Anxiety   . Asthma   . Seasonal allergies     Patient Active Problem List   Diagnosis Date Noted  . Right shoulder pain 08/28/2011  . ANXIETY 09/01/2010  . ASTHMA 09/01/2010  . 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 CHOLECYSTECTOMY         Home Medications    Prior to Admission medications   Medication Sig Start Date End Date Taking? Authorizing Provider  albuterol (PROVENTIL HFA;VENTOLIN HFA) 108 (90 BASE) MCG/ACT inhaler Inhale 2 puffs into the lungs every 6 (six) hours as needed for wheezing. 03/02/12 03/02/13  Lattie Haw, MD  albuterol (PROVENTIL) (2.5 MG/3ML) 0.083% nebulizer solution Take 2.5 mg by nebulization every 6 (six) hours as needed.      [provider]  azithromycin (ZITHROMAX) 250 MG tablet Take 1 tablet (250 mg total) by mouth daily. Take first 2 tablets together, then 1 every day until finished. 06/24/18   Lurene Shadow, PA-C  benzonatate (TESSALON) 100 MG capsule Take 1-2 capsules (100-200 mg total) by mouth every 8 (eight) hours. 06/24/18   Lurene Shadow, PA-C  buPROPion (WELLBUTRIN XL) 300 MG 24 hr tablet Take 300 mg by mouth daily.      [provider]  cetirizine (ZYRTEC) 10 MG tablet Take 10 mg by mouth daily.      [provider]  clonazePAM (KLONOPIN) 0.5 MG tablet Take 0.5 mg by  mouth 2 (two) times daily as needed.      [provider]  Fluticasone-Salmeterol (ADVAIR) 250-50 MCG/DOSE AEPB Inhale 1 puff into the lungs every 12 (twelve) hours.    [provider]  predniSONE (DELTASONE) 20 MG tablet 3 tabs po day one, then 2 po daily x 4 days 06/24/18   Lurene Shadow, PA-C    Family History Family History  Problem Relation Age of Onset  . Hypertension Mother   . Diabetes Neg Hx   . Heart attack Neg Hx   . Hyperlipidemia Neg Hx     Social History Social History   Tobacco Use  . Smoking status: Never Smoker  . Smokeless tobacco: Never Used  Substance Use Topics  . Alcohol use: No  . Drug use: No     Allergies   Montelukast sodium and Promethazine hcl   Review of Systems Review of Systems  Constitutional: Negative for chills and fever.  HENT: Positive for congestion and sore throat. Negative for ear pain, trouble swallowing and voice change.   Respiratory: Positive for cough, chest tightness and shortness of breath.   Cardiovascular: Negative for chest pain and palpitations.  Gastrointestinal: Negative for abdominal pain, diarrhea, nausea and vomiting.  Musculoskeletal: Negative for arthralgias, back pain and myalgias.  Skin: Negative for rash.     Physical Exam  Triage Vital Signs ED Triage Vitals [06/24/18 1014]  Enc Vitals Group     BP 116/79     Pulse Rate 95     Resp      Temp 98.3 F (36.8 C)     Temp Source Oral     SpO2 99 %     Weight 197 lb (89.4 kg)     Height 5\' 9"  (1.753 m)     Head Circumference      Peak Flow      Pain Score 0     Pain Loc      Pain Edu?      Excl. in GC?    No data found.  Updated Vital Signs BP 116/79 (BP Location: Right Arm)   Pulse 95   Temp 98.3 F (36.8 C) (Oral)   Ht 5\' 9"  (1.753 m)   Wt 197 lb (89.4 kg)   SpO2 99%   BMI 29.09 kg/m   Visual Acuity Right Eye Distance:   Left Eye Distance:   Bilateral Distance:    Right Eye Near:   Left Eye Near:    Bilateral  Near:     Physical Exam  Constitutional: He is oriented to person, place, and time. He appears well-developed and well-nourished. No distress.  HENT:  Head: Normocephalic and atraumatic.  Right Ear: Tympanic membrane normal.  Left Ear: Tympanic membrane normal.  Nose: Nose normal. Right sinus exhibits no maxillary sinus tenderness and no frontal sinus tenderness. Left sinus exhibits no maxillary sinus tenderness and no frontal sinus tenderness.  Mouth/Throat: Uvula is midline, oropharynx is clear and moist and mucous membranes are normal.  Eyes: EOM are normal.  Neck: Normal range of motion. Neck supple.  Cardiovascular: Normal rate and regular rhythm.  Pulmonary/Chest: Effort normal and breath sounds normal. No stridor. No respiratory distress. He has no wheezes. He has no rales.  Musculoskeletal: Normal range of motion.  Neurological: He is alert and oriented to person, place, and time.  Skin: Skin is warm and dry. He is not diaphoretic.  Psychiatric: He has a normal mood and affect. His behavior is normal.  Nursing note and vitals reviewed.    UC Treatments / Results  Labs (all labs ordered are listed, but only abnormal results are displayed) Labs Reviewed - No data to display  EKG None  Radiology No results found.  Procedures Procedures (including critical care time)  Medications Ordered in UC Medications - No data to display  Initial Impression / Assessment and Plan / UC Course  I have reviewed the triage vital signs and the nursing notes.  Pertinent labs & imaging results that were available during my care of the patient were reviewed by me and considered in my medical decision making (see chart for details).    Lungs: CTAB, O2 Sat 99% on RA Hx and exam c/w viral illness at this time. Pt encouraged to try prednisone for symptomatic treatment. Prescription for azithromycin given to hold, may fill if symptoms persist/worsen.  Final Clinical Impressions(s) / UC  Diagnoses   Final diagnoses:  Viral URI with cough  Mild intermittent asthma with exacerbation     Discharge Instructions      Your symptoms are likely due to a virus such as the common cold, however, if you developing worsening chest congestion with shortness of breath, persistent fever (>100.4*F) for 3 days, or symptoms not improving in 4-5 days, you may fill the antibiotic (azithromycin).  If you do fill the antibiotic,  please take antibiotics as prescribed and be sure to complete entire course even if you start to feel better to ensure infection does not come back.     ED Prescriptions    Medication Sig Dispense Auth. Provider   predniSONE (DELTASONE) 20 MG tablet 3 tabs po day one, then 2 po daily x 4 days 11 tablet Morenike Cuff O, PA-C   benzonatate (TESSALON) 100 MG capsule Take 1-2 capsules (100-200 mg total) by mouth every 8 (eight) hours. 21 capsule Doroteo Glassman, Ulysee Fyock O, PA-C   azithromycin (ZITHROMAX) 250 MG tablet Take 1 tablet (250 mg total) by mouth daily. Take first 2 tablets together, then 1 every day until finished. 6 tablet Lurene Shadow, PA-C     Controlled Substance Prescriptions [place] Controlled Substance Registry consulted? Not Applicable   Rolla Plate 06/24/18 1147

## 2018-06-24 NOTE — ED Triage Notes (Signed)
Pt c/o cough with dark yellow phlegm. Started last Fri with a sore throat. Also c/o wheezing/difficulty breathing. Hx of bronchitis.  Taking nyquil and robitussin with expectorant.

## 2018-08-18 ENCOUNTER — Other Ambulatory Visit: Payer: Self-pay

## 2018-08-18 ENCOUNTER — Emergency Department (INDEPENDENT_AMBULATORY_CARE_PROVIDER_SITE_OTHER)
Admission: EM | Admit: 2018-08-18 | Discharge: 2018-08-18 | Disposition: A | Discharge status: Home / Self Care | Payer: BLUE CROSS/BLUE SHIELD | Source: Home / Self Care | Attending: Family Medicine | Admitting: Family Medicine

## 2018-08-18 DIAGNOSIS — J069 Acute upper respiratory infection, unspecified: Secondary | ICD-10-CM | POA: Diagnosis not present

## 2018-08-18 DIAGNOSIS — B9789 Other viral agents as the cause of diseases classified elsewhere: Secondary | ICD-10-CM

## 2018-08-18 DIAGNOSIS — J9801 Acute bronchospasm: Secondary | ICD-10-CM

## 2018-08-18 MED ORDER — METHYLPREDNISOLONE ACETATE 80 MG/ML IJ SUSP
80.0000 mg | Freq: Once | INTRAMUSCULAR | Status: AC
  Administered 2018-08-18: 80 mg via INTRAMUSCULAR

## 2018-08-18 MED ORDER — PREDNISONE 20 MG PO TABS: ORAL_TABLET | ORAL | 0 refills | Status: DC

## 2018-08-18 MED ORDER — BENZONATATE 200 MG PO CAPS: ORAL_CAPSULE | ORAL | 0 refills | Status: DC

## 2018-08-18 MED ORDER — AZITHROMYCIN 250 MG PO TABS: 250.0000 mg | ORAL_TABLET | Freq: Every day | ORAL | 0 refills | Status: DC

## 2018-08-18 NOTE — Discharge Instructions (Signed)
Begin prednisone Friday 08/19/18. Take plain guaifenesin (1200mg  extended release tabs such as Mucinex) twice daily, with plenty of water, for cough and congestion.  May add Pseudoephedrine (30mg , one or two every 4 to 6 hours) for sinus congestion.  Get adequate rest.   May use Afrin nasal spray (or generic oxymetazoline) each morning for about 5 days and then discontinue.  Also recommend using saline nasal spray several times daily and saline nasal irrigation (AYR is a common brand).  Use Flonase nasal spray each morning after using Afrin nasal spray and saline nasal irrigation. Try warm salt water gargles for sore throat.  Stop all antihistamines for now, and other non-prescription cough/cold preparations. May take Tylenol as needed for fever, headache, etc. Continue albuterol inhaler as needed. May take Delsym Cough Suppressant with Tessalon at bedtime for nighttime cough.  Begin Azithromycin if not improving about one week or if persistent fever develops

## 2018-08-18 NOTE — ED Triage Notes (Signed)
Pt c/o cold sxs since Monday. Started as sore throat which has since subsided. Chills and fatigue. Chest tightness and hard to breath. Pt does mention he has asthma. Taking benedryl/sudabed combo prn.

## 2018-08-18 NOTE — ED Provider Notes (Signed)
[facility]    CSN: [id number] Arrival date & time: 08/18/18  4098     History   Chief Complaint Chief Complaint  Patient presents with  . Chills    with Fatigue  . Emesis    HPI Donald Gillespie is a 43 y.o. male.   Patient complains of four day history of typical cold-like symptoms developing over several days, including mild sore throat, sinus congestion, headache, fatigue, and cough.  He had two episodes of nausea/vomiting, now resolved.  He has a history of mild exercise induced asthma and has had to use his albuterol MDI more frequently during the past 3 days.  He has had chills/sweats without fever.  The history is provided by the patient.    Past Medical History:  Diagnosis Date  . Anxiety   . Asthma   . Seasonal allergies     Patient Active Problem List   Diagnosis Date Noted  . Right shoulder pain 08/28/2011  . ANXIETY 09/01/2010  . ASTHMA 09/01/2010  . 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 CHOLECYSTECTOMY         Home Medications    Prior to Admission medications   Medication Sig Start Date End Date Taking? Authorizing Provider  albuterol (PROVENTIL HFA;VENTOLIN HFA) 108 (90 BASE) MCG/ACT inhaler Inhale 2 puffs into the lungs every 6 (six) hours as needed for wheezing. 03/02/12 03/02/13  Lattie Haw, MD  albuterol (PROVENTIL) (2.5 MG/3ML) 0.083% nebulizer solution Take 2.5 mg by nebulization every 6 (six) hours as needed.      [provider]  azithromycin (ZITHROMAX) 250 MG tablet Take 1 tablet (250 mg total) by mouth daily. Take first 2 tablets together, then 1 every day until finished (Rx void after 08/25/18) 08/18/18   Lattie Haw, MD  benzonatate (TESSALON) 200 MG capsule Take one cap by mouth at bedtime as needed for cough.  May repeat in 4 to 6 hours 08/18/18   Lattie Haw, MD  buPROPion (WELLBUTRIN XL) 300 MG 24 hr tablet Take 300 mg by mouth daily.      [provider]  cetirizine (ZYRTEC) 10 MG tablet Take 10 mg by mouth daily.      [provider]  clonazePAM (KLONOPIN) 0.5 MG tablet Take 0.5 mg by mouth 2 (two) times daily as needed.      [provider]  Fluticasone-Salmeterol (ADVAIR) 250-50 MCG/DOSE AEPB Inhale 1 puff into the lungs every 12 (twelve) hours.    [provider]  predniSONE (DELTASONE) 20 MG tablet Take one tab by mouth twice daily for 4 days, then one daily for 3 days. Take with food. 08/18/18   Lattie Haw, MD    Family History Family History  Problem Relation Age of Onset  . Hypertension Mother   . Diabetes Neg Hx   . Heart attack Neg Hx   . Hyperlipidemia Neg Hx     Social History Social History   Tobacco Use  . Smoking status: Never Smoker  . Smokeless tobacco: Never Used  Substance Use Topics  . Alcohol use: No  . Drug use: No     Allergies   Montelukast sodium and Promethazine hcl   Review of Systems Review of Systems + sore throat + cough No pleuritic pain + wheezing + nasal congestion + post-nasal drainage No sinus pain/pressure No itchy/red eyes No earache No hemoptysis No SOB No fever, + chills/sweats + nausea, resolved +  vomiting, resolved No abdominal pain No diarrhea No urinary symptoms No skin rash + fatigue No myalgias + headache Used OTC meds without relief   Physical Exam Triage Vital Signs ED Triage Vitals  Enc Vitals Group     BP 08/18/18 0857 115/72     Pulse Rate 08/18/18 0857 99     Resp 08/18/18 0857 16     Temp 08/18/18 0857 98 F (36.7 C)     Temp Source 08/18/18 0857 Oral     SpO2 08/18/18 0857 98 %     Weight 08/18/18 0858 197 lb (89.4 kg)     Height 08/18/18 0858 5\' 9"  (1.753 m)     Head Circumference --      Peak Flow --      Pain Score 08/18/18 0858 0     Pain Loc --      Pain Edu? --      Excl. in GC? --    No data found.  Updated Vital Signs BP 115/72 (BP Location: Right Arm)   Pulse 99   Temp 98 F  (36.7 C) (Oral)   Resp 16   Ht 5\' 9"  (1.753 m)   Wt 89.4 kg   SpO2 98%   BMI 29.09 kg/m   Visual Acuity Right Eye Distance:   Left Eye Distance:   Bilateral Distance:    Right Eye Near:   Left Eye Near:    Bilateral Near:     Physical Exam Nursing notes and Vital Signs reviewed. Appearance:  Patient appears stated age, and in no acute distress Eyes:  Pupils are equal, round, and reactive to light and accomodation.  Extraocular movement is intact.  Conjunctivae are not inflamed  Ears:  Canals normal.  Tympanic membranes normal.  Nose:  Mildly congested turbinates.  No sinus tenderness.  Pharynx:  Normal Neck:  Supple.  Enlarged posterior/lateral nodes are palpated bilaterally, tender to palpation on the left.   Lungs:  Clear to auscultation.  Breath sounds are equal.  Moving air well. Heart:  Regular rate and rhythm without murmurs, rubs, or gallops.  Abdomen:  Nontender without masses or hepatosplenomegaly.  Bowel sounds are present.  No CVA or flank tenderness.  Extremities:  No edema.  Skin:  No rash present.    UC Treatments / Results  Labs (all labs ordered are listed, but only abnormal results are displayed) Labs Reviewed - No data to display  EKG None  Radiology No results found.  Procedures Procedures (including critical care time)  Medications Ordered in UC Medications  methylPREDNISolone acetate (DEPO-MEDROL) injection 80 mg (has no administration in time range)    Initial Impression / Assessment and Plan / UC Course  I have reviewed the triage vital signs and the nursing notes.  Pertinent labs & imaging results that were available during my care of the patient were reviewed by me and considered in my medical decision making (see chart for details).    There is no evidence of bacterial infection today.  Treat symptomatically for now  Administered Depo Medrol 80mg  IM.  Begin prednisone burst/taper. Prescription written for Benzonatate [facility](Tessalon) to  take at bedtime for night-time cough.  Followup with Family Doctor if not improved in about 10 days.   Final Clinical Impressions(s) / UC Diagnoses   Final diagnoses:  Viral URI with cough  Bronchospasm     Discharge Instructions     Begin prednisone Friday 08/19/18. Take plain guaifenesin (1200mg  extended release tabs such as Mucinex) twice  daily, with plenty of water, for cough and congestion.  May add Pseudoephedrine (30mg , one or two every 4 to 6 hours) for sinus congestion.  Get adequate rest.   May use Afrin nasal spray (or generic oxymetazoline) each morning for about 5 days and then discontinue.  Also recommend using saline nasal spray several times daily and saline nasal irrigation (AYR is a common brand).  Use Flonase nasal spray each morning after using Afrin nasal spray and saline nasal irrigation. Try warm salt water gargles for sore throat.  Stop all antihistamines for now, and other non-prescription cough/cold preparations. May take Tylenol as needed for fever, headache, etc. Continue albuterol inhaler as needed. May take Delsym Cough Suppressant with Tessalon at bedtime for nighttime cough.  Begin Azithromycin if not improving about one week or if persistent fever develops (Given a prescription to hold, with an expiration date)       ED Prescriptions    Medication Sig Dispense Auth. Provider   azithromycin (ZITHROMAX) 250 MG tablet Take 1 tablet (250 mg total) by mouth daily. Take first 2 tablets together, then 1 every day until finished (Rx void after 08/25/18) 6 tablet Lattie HawBeese, Tesean Stump A, MD   benzonatate (TESSALON) 200 MG capsule Take one cap by mouth at bedtime as needed for cough.  May repeat in 4 to 6 hours 15 capsule Lattie HawBeese, Brennan Karam A, MD   predniSONE (DELTASONE) 20 MG tablet Take one tab by mouth twice daily for 4 days, then one daily for 3 days. Take with food. 11 tablet Lattie HawBeese, Jeffie Spivack A, MD        Lattie HawBeese, Green Quincy A, MD 08/19/18 [phone]

## 2019-05-29 ENCOUNTER — Encounter: Payer: Self-pay | Admitting: Emergency Medicine

## 2019-05-29 ENCOUNTER — Emergency Department (INDEPENDENT_AMBULATORY_CARE_PROVIDER_SITE_OTHER)
Admission: EM | Admit: 2019-05-29 | Discharge: 2019-05-29 | Disposition: A | Discharge status: Home / Self Care | Payer: BC Managed Care – PPO | Source: Home / Self Care | Attending: Family Medicine | Admitting: Family Medicine

## 2019-05-29 ENCOUNTER — Other Ambulatory Visit: Payer: Self-pay

## 2019-05-29 DIAGNOSIS — S70312A Abrasion, left thigh, initial encounter: Secondary | ICD-10-CM

## 2019-05-29 MED ORDER — DOXYCYCLINE HYCLATE 100 MG PO CAPS: 100.0000 mg | ORAL_CAPSULE | Freq: Two times a day (BID) | ORAL | 0 refills | Status: DC

## 2019-05-29 NOTE — ED Triage Notes (Signed)
Pt states he fell off a ladder on Saturday and cut his left thigh on a metal post. States it is still painful and some bleeding. States UTD on tetanus.

## 2019-05-29 NOTE — Discharge Instructions (Signed)
Change dressing daily and apply Bacitracin ointment to wound.  Keep wound clean and dry.  Return for any signs of infection (or follow-up with family doctor):  Increasing redness, swelling, pain, heat, drainage, etc.    

## 2019-05-29 NOTE — ED Provider Notes (Signed)
[name]    CSN: [id number] Arrival date & time: 05/29/19  0849      History   Chief Complaint Chief Complaint  Patient presents with  . Laceration    HPI Donald Gillespie is a 43 y.o. male.   Patient abraded his left thigh on a metal post two days ago.  The area is still somewhat painful.  His tetanus immunization is current.  The history is provided by the patient.    Past Medical History:  Diagnosis Date  . Anxiety   . Asthma   . Seasonal allergies     Patient Active Problem List   Diagnosis Date Noted  . Right shoulder pain 08/28/2011  . ANXIETY 09/01/2010  . ASTHMA 09/01/2010  . 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 CHOLECYSTECTOMY         Home Medications    Prior to Admission medications   Medication Sig Start Date End Date Taking? Authorizing Provider  albuterol (PROVENTIL HFA;VENTOLIN HFA) 108 (90 BASE) MCG/ACT inhaler Inhale 2 puffs into the lungs every 6 (six) hours as needed for wheezing. 03/02/12 03/02/13  Lattie Haw, MD  albuterol (PROVENTIL) (2.5 MG/3ML) 0.083% nebulizer solution Take 2.5 mg by nebulization every 6 (six) hours as needed.      [provider]  azithromycin (ZITHROMAX) 250 MG tablet Take 1 tablet (250 mg total) by mouth daily. Take first 2 tablets together, then 1 every day until finished (Rx void after 08/25/18) 08/18/18   Lattie Haw, MD  benzonatate (TESSALON) 200 MG capsule Take one cap by mouth at bedtime as needed for cough.  May repeat in 4 to 6 hours 08/18/18   Lattie Haw, MD  buPROPion (WELLBUTRIN XL) 300 MG 24 hr tablet Take 300 mg by mouth daily.      [provider]  cetirizine (ZYRTEC) 10 MG tablet Take 10 mg by mouth daily.      [provider]  clonazePAM (KLONOPIN) 0.5 MG tablet Take 0.5 mg by mouth 2 (two) times daily as needed.      [provider]  doxycycline (VIBRAMYCIN) 100 MG capsule Take 1 capsule  (100 mg total) by mouth 2 (two) times daily. Take with food. 05/29/19   Lattie Haw, MD  Fluticasone-Salmeterol (ADVAIR) 250-50 MCG/DOSE AEPB Inhale 1 puff into the lungs every 12 (twelve) hours.    [provider]  predniSONE (DELTASONE) 20 MG tablet Take one tab by mouth twice daily for 4 days, then one daily for 3 days. Take with food. 08/18/18   Lattie Haw, MD    Family History Family History  Problem Relation Age of Onset  . Hypertension Mother   . Diabetes Neg Hx   . Heart attack Neg Hx   . Hyperlipidemia Neg Hx     Social History Social History   Tobacco Use  . Smoking status: Never Smoker  . Smokeless tobacco: Never Used  Substance Use Topics  . Alcohol use: No  . Drug use: No     Allergies   Montelukast sodium and Promethazine hcl   Review of Systems Review of Systems  Constitutional: Negative.   Skin: Positive for color change and wound.     Physical Exam Triage Vital Signs ED Triage Vitals  Enc Vitals Group     BP 05/29/19 0944 125/77     Pulse Rate 05/29/19 0944 68     Resp --  Temp 05/29/19 0944 97.8 F (36.6 C)     Temp Source 05/29/19 0944 Oral     SpO2 05/29/19 0944 99 %     Weight 05/29/19 0942 196 lb (88.9 kg)     Height --      Head Circumference --      Peak Flow --      Pain Score 05/29/19 0941 5     Pain Loc --      Pain Edu? --      Excl. in [place]? --    No data found.  Updated Vital Signs BP 125/77 (BP Location: Right Arm)   Pulse 68   Temp 97.8 F (36.6 C) (Oral)   Wt 88.9 kg   SpO2 99%   BMI 28.94 kg/m   Visual Acuity Right Eye Distance:   Left Eye Distance:   Bilateral Distance:    Right Eye Near:   Left Eye Near:    Bilateral Near:     Physical Exam Vitals signs and nursing note reviewed.  Constitutional:      General: He is not in acute distress. HENT:     Head: Normocephalic.  Eyes:     Pupils: Pupils are equal, round, and reactive to light.  Cardiovascular:     Rate and Rhythm:  Normal rate.  Pulmonary:     Effort: Pulmonary effort is normal.  Skin:    General: Skin is warm and dry.          Comments: 15cm long superficial abrasion left posterior/lateral thigh as noted on diagram.  Minimal surrounding erythema, mildly tender to palpation  Neurological:     Mental Status: He is alert.      UC Treatments / Results  Labs (all labs ordered are listed, but only abnormal results are displayed) Labs Reviewed - No data to display  EKG   Radiology No results found.  Procedures Procedures (including critical care time)  Medications Ordered in UC Medications - No data to display  Initial Impression / Assessment and Plan / UC Course  I have reviewed the triage vital signs and the nursing notes.  Pertinent labs & imaging results that were available during my care of the patient were reviewed by me and considered in my medical decision making (see chart for details).    Begin doxycycline for possible staph coverage. Wound dressed with bacitracin and non-stick dressing.   Final Clinical Impressions(s) / UC Diagnoses   Final diagnoses:  Abrasion, left thigh, initial encounter     Discharge Instructions     Change dressing daily and apply Bacitracin ointment to wound.  Keep wound clean and dry.  Return for any signs of infection (or follow-up with family doctor):  Increasing redness, swelling, pain, heat, drainage, etc.      ED Prescriptions    Medication Sig Dispense Auth. Provider   doxycycline (VIBRAMYCIN) 100 MG capsule Take 1 capsule (100 mg total) by mouth 2 (two) times daily. Take with food. 20 capsule Kandra Nicolas, MD        Kandra Nicolas, MD 05/29/19 [phone]

## 2019-06-06 ENCOUNTER — Other Ambulatory Visit: Payer: Self-pay

## 2019-06-06 ENCOUNTER — Emergency Department
Admission: EM | Admit: 2019-06-06 | Discharge: 2019-06-06 | Disposition: A | Discharge status: Home / Self Care | Payer: BC Managed Care – PPO | Source: Home / Self Care | Attending: Internal Medicine | Admitting: Internal Medicine

## 2019-06-06 DIAGNOSIS — S70312D Abrasion, left thigh, subsequent encounter: Secondary | ICD-10-CM

## 2019-06-06 DIAGNOSIS — Z5189 Encounter for other specified aftercare: Secondary | ICD-10-CM | POA: Diagnosis not present

## 2019-06-06 NOTE — ED Triage Notes (Signed)
Pt here to have wound checked. Was here last Monday. Abrasion to LT upper thigh on metal post two weekends ago. Says its oozing a yellow discharge. Currently on  doxycycline.

## 2019-06-06 NOTE — ED Provider Notes (Signed)
[facility]    CSN: [id number] Arrival date & time: 06/06/19  1339      History   Chief Complaint Chief Complaint  Patient presents with  . Wound Check    HPI Donald Gillespie is a 43 y.o. male with a history of asthma-controlled comes to urgent care for evaluation of mid thigh wound.  Patient was seen in the urgent care on 11/2 for abrasion over posterior aspect of the left thigh.  Patient was prescribed doxycycline for 10 days and recommendation of daily wound dressing changes with bacitracin was recommended.  Patient comes to urgent care with complaint of increased itching around the wound site.  Patient says he is noted some thin yellowish discharge which soaks the dressing.  No redness around the wound.  Denies any pain.   No fever or chills.  HPI  Past Medical History:  Diagnosis Date  . Anxiety   . Asthma   . Seasonal allergies     Patient Active Problem List   Diagnosis Date Noted  . Right shoulder pain 08/28/2011  . ANXIETY 09/01/2010  . ASTHMA 09/01/2010  . 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 CHOLECYSTECTOMY         Home Medications    Prior to Admission medications   Medication Sig Start Date End Date Taking? Authorizing Provider  albuterol (PROVENTIL HFA;VENTOLIN HFA) 108 (90 BASE) MCG/ACT inhaler Inhale 2 puffs into the lungs every 6 (six) hours as needed for wheezing. 03/02/12 03/02/13  Lattie HawBeese, Stephen A, MD  albuterol (PROVENTIL) (2.5 MG/3ML) 0.083% nebulizer solution Take 2.5 mg by nebulization every 6 (six) hours as needed.      [provider]  azithromycin (ZITHROMAX) 250 MG tablet Take 1 tablet (250 mg total) by mouth daily. Take first 2 tablets together, then 1 every day until finished (Rx void after 08/25/18) 08/18/18   Lattie HawBeese, Stephen A, MD  benzonatate (TESSALON) 200 MG capsule Take one cap by mouth at bedtime as needed for cough.  May repeat in 4 to 6 hours 08/18/18   Lattie HawBeese,  Stephen A, MD  buPROPion (WELLBUTRIN XL) 300 MG 24 hr tablet Take 300 mg by mouth daily.      [provider]  cetirizine (ZYRTEC) 10 MG tablet Take 10 mg by mouth daily.      [provider]  clonazePAM (KLONOPIN) 0.5 MG tablet Take 0.5 mg by mouth 2 (two) times daily as needed.      [provider]  doxycycline (VIBRAMYCIN) 100 MG capsule Take 1 capsule (100 mg total) by mouth 2 (two) times daily. Take with food. 05/29/19   Lattie HawBeese, Stephen A, MD  Fluticasone-Salmeterol (ADVAIR) 250-50 MCG/DOSE AEPB Inhale 1 puff into the lungs every 12 (twelve) hours.    [provider]  predniSONE (DELTASONE) 20 MG tablet Take one tab by mouth twice daily for 4 days, then one daily for 3 days. Take with food. 08/18/18   Lattie HawBeese, Stephen A, MD    Family History Family History  Problem Relation Age of Onset  . Hypertension Mother   . Diabetes Neg Hx   . Heart attack Neg Hx   . Hyperlipidemia Neg Hx     Social History Social History   Tobacco Use  . Smoking status: Never Smoker  . Smokeless tobacco: Never Used  Substance Use Topics  . Alcohol use: No  . Drug use: No     Allergies   Montelukast sodium and  Promethazine hcl   Review of Systems Review of Systems  Constitutional: Negative.   Respiratory: Negative.   Cardiovascular: Negative.   Gastrointestinal: Negative.   Genitourinary: Negative.   Musculoskeletal: Negative.   Skin: Positive for wound. Negative for color change, pallor and rash.  Neurological: Negative.      Physical Exam Triage Vital Signs ED Triage Vitals  Enc Vitals Group     BP 06/06/19 1403 127/81     Pulse Rate 06/06/19 1403 76     Resp --      Temp 06/06/19 1403 98.4 F (36.9 C)     Temp Source 06/06/19 1403 Oral     SpO2 06/06/19 1403 98 %     Weight 06/06/19 1404 203 lb (92.1 kg)     Height 06/06/19 1404 5\' 9"  (1.753 m)     Head Circumference --      Peak Flow --      Pain Score 06/06/19 1404 2     Pain Loc --       Pain Edu? --      Excl. in [place]? --    No data found.  Updated Vital Signs BP 127/81 (BP Location: Right Arm)   Pulse 76   Temp 98.4 F (36.9 C) (Oral)   Ht 5\' 9"  (1.753 m)   Wt 92.1 kg   SpO2 98%   BMI 29.98 kg/m   Visual Acuity Right Eye Distance:   Left Eye Distance:   Bilateral Distance:    Right Eye Near:   Left Eye Near:    Bilateral Near:     Physical Exam Vitals signs and nursing note reviewed.  Constitutional:      General: He is not in acute distress.    Appearance: He is not ill-appearing or toxic-appearing.  Cardiovascular:     Rate and Rhythm: Normal rate and regular rhythm.     Pulses: Normal pulses.     Heart sounds: Normal heart sounds.  Pulmonary:     Effort: Pulmonary effort is normal.     Breath sounds: Normal breath sounds. No wheezing, rhonchi or rales.  Abdominal:     General: Bowel sounds are normal.     Palpations: Abdomen is soft.     Tenderness: There is no guarding or rebound.     Hernia: No hernia is present.  Skin:    Comments: Abrasions over the posterior aspect of the left thigh with signs of healing.  Scant serosanguineous discharge.  No surrounding erythema.  Neurological:     General: No focal deficit present.     Mental Status: He is alert and oriented to person, place, and time.      UC Treatments / Results  Labs (all labs ordered are listed, but only abnormal results are displayed) Labs Reviewed - No data to display  EKG   Radiology No results found.  Procedures Procedures (including critical care time)  Medications Ordered in UC Medications - No data to display  Initial Impression / Assessment and Plan / UC Course  I have reviewed the triage vital signs and the nursing notes.  Pertinent labs & imaging results that were available during my care of the patient were reviewed by me and considered in my medical decision making (see chart for details).     1.  Abrasion over the left thigh with good healing:  Continue doxycycline Daily wound dressing changes with bacitracin If patient notices erythema spreading away from the abrasion site or purulent discharge, fever  is advised to return to urgent care for reevaluation.  At this time the wound seems to be healing well. Final Clinical Impressions(s) / UC Diagnoses   Final diagnoses:  None   Discharge Instructions   None    ED Prescriptions    None     PDMP not reviewed this encounter.   Chase Picket, MD 06/06/19 [phone]

## 2020-03-12 ENCOUNTER — Encounter: Payer: Self-pay | Admitting: Physician Assistant

## 2020-03-12 ENCOUNTER — Ambulatory Visit (INDEPENDENT_AMBULATORY_CARE_PROVIDER_SITE_OTHER): Payer: BC Managed Care – PPO | Admitting: Physician Assistant

## 2020-03-12 VITALS — BP 131/68 | HR 72 | Ht 69.0 in | Wt 206.0 lb

## 2020-03-12 DIAGNOSIS — T7840XA Allergy, unspecified, initial encounter: Secondary | ICD-10-CM | POA: Insufficient documentation

## 2020-03-12 DIAGNOSIS — F419 Anxiety disorder, unspecified: Secondary | ICD-10-CM

## 2020-03-12 DIAGNOSIS — T7840XD Allergy, unspecified, subsequent encounter: Secondary | ICD-10-CM | POA: Diagnosis not present

## 2020-03-12 DIAGNOSIS — Z7689 Persons encountering health services in other specified circumstances: Secondary | ICD-10-CM | POA: Diagnosis not present

## 2020-03-12 DIAGNOSIS — J4520 Mild intermittent asthma, uncomplicated: Secondary | ICD-10-CM | POA: Diagnosis not present

## 2020-03-12 NOTE — Progress Notes (Signed)
New Patient Office Visit  Subjective:  Patient ID: Donald Gillespie, male    DOB: [birth year]  Age: 44 y.o. MRN: [id number]  CC:  Chief Complaint  Patient presents with  . Establish Care    HPI Donald Gillespie presents to establish care.   He is having no problems today. He uses albuterol before exercise and when he gets sick.   Past Medical History:  Diagnosis Date  . Anxiety   . Asthma   . Depression   . Seasonal allergies     Past Surgical History:  Procedure Laterality Date  . CHOLECYSTECTOMY    . VASECTOMY      Family History  Problem Relation Age of Onset  . Hypertension Mother   . Breast cancer Other   . Cancer Other   . Diabetes Neg Hx   . Heart attack Neg Hx   . Hyperlipidemia Neg Hx     Social History   Socioeconomic History  . Marital status: Married    Spouse name: Not on file  . Number of children: Not on file  . Years of education: Not on file  . Highest education level: Not on file  Occupational History  . Not on file  Tobacco Use  . Smoking status: Never Smoker  . Smokeless tobacco: Never Used  Vaping Use  . Vaping Use: Never used  Substance and Sexual Activity  . Alcohol use: Yes    Comment: 3 drinks a week  . Drug use: No  . Sexual activity: Yes    Partners: Female    Birth control/protection: Surgical  Other Topics Concern  . Not on file  Social History Narrative  . Not on file   Social Determinants of Health   Financial Resource Strain:   . Difficulty of Paying Living Expenses:   Food Insecurity:   . Worried About Programme researcher, broadcasting/film/video in the Last Year:   . Barista in the Last Year:   Transportation Needs:   . Freight forwarder (Medical):   Marland Kitchen Lack of Transportation (Non-Medical):   Physical Activity:   . Days of Exercise per Week:   . Minutes of Exercise per Session:   Stress:   . Feeling of Stress :   Social Connections:   . Frequency of Communication with Friends and Family:   . Frequency of Social  Gatherings with Friends and Family:   . Attends Religious Services:   . Active Member of Clubs or Organizations:   . Attends Banker Meetings:   Marland Kitchen Marital Status:   Intimate Partner Violence:   . Fear of Current or Ex-Partner:   . Emotionally Abused:   Marland Kitchen Physically Abused:   . Sexually Abused:     ROS Review of Systems  All other systems reviewed and are negative.   Objective:   Today's Vitals: BP 131/68   Pulse 72   Ht 5\' 9"  (1.753 m)   Wt 206 lb (93.4 kg)   SpO2 100%   BMI 30.42 kg/m   Physical Exam Vitals reviewed.  Constitutional:      Appearance: Normal appearance.  HENT:     Head: Normocephalic.  Cardiovascular:     Rate and Rhythm: Normal rate and regular rhythm.     Pulses: Normal pulses.  Pulmonary:     Effort: Pulmonary effort is normal.     Breath sounds: Normal breath sounds.  Abdominal:     Palpations: Abdomen is soft.     Tenderness:  There is no abdominal tenderness.  Neurological:     General: No focal deficit present.     Mental Status: He is alert and oriented to person, place, and time.  Psychiatric:        Mood and Affect: Mood normal.   .. Depression screen [facility] 2/9 03/12/2020  Decreased Interest 1  Down, Depressed, Hopeless 1  PHQ - 2 Score 2  Altered sleeping 2  Tired, decreased energy 2  Change in appetite 0  Feeling bad or failure about yourself  0  Trouble concentrating 0  Moving slowly or fidgety/restless 0  Suicidal thoughts 0  PHQ-9 Score 6  Difficult doing work/chores Not difficult at all    .Marland Kitchen GAD 7 : Generalized Anxiety Score 03/12/2020  Nervous, Anxious, on Edge 0  Control/stop worrying 0  Worry too much - different things 0  Trouble relaxing 2  Restless 0  Easily annoyed or irritable 0  Afraid - awful might happen 0  Total GAD 7 Score 2  Anxiety Difficulty Not difficult at all       Assessment & Plan:   .Marland KitchenCelestino was seen today for establish care.  Diagnoses and all orders for this  visit:  Encounter to establish care  Mild intermittent asthma without complication  Anxiety  Allergy, subsequent encounter  Other orders -     Cancel: Hepatitis C Antibody -     Cancel: Lipid Panel w/reflex Direct LDL -     Cancel: COMPLETE METABOLIC PANEL WITH GFR   Will get labs and flu shot at CPE in October.   No refills needed.   Anxiety is fairly well controlled with exercise.   Follow-up: Return in about 2 months (around 05/12/2020) for Needs CPE.   Tandy Gaw, PA-C

## 2020-03-12 NOTE — Patient Instructions (Signed)

## 2020-04-19 ENCOUNTER — Encounter: Payer: Self-pay | Admitting: Physician Assistant

## 2020-04-19 DIAGNOSIS — Z13220 Encounter for screening for lipoid disorders: Secondary | ICD-10-CM

## 2020-04-19 DIAGNOSIS — Z0000 Encounter for general adult medical examination without abnormal findings: Secondary | ICD-10-CM

## 2020-04-19 DIAGNOSIS — Z131 Encounter for screening for diabetes mellitus: Secondary | ICD-10-CM

## 2020-05-07 LAB — CBC
MCH: 31.2 pg (ref 27.0–33.0)
RBC: 5.19 10*6/uL (ref 4.20–5.80)
WBC: 5.8 10*3/uL (ref 3.8–10.8)

## 2020-05-08 LAB — COMPLETE METABOLIC PANEL WITH GFR
AG Ratio: 2 (calc) (ref 1.0–2.5)
ALT: 19 U/L (ref 9–46)
AST: 18 U/L (ref 10–40)
Albumin: 4.5 g/dL (ref 3.6–5.1)
Alkaline phosphatase (APISO): 55 U/L (ref 36–130)
BUN: 15 mg/dL (ref 7–25)
CO2: 28 mmol/L (ref 20–32)
Calcium: 9.5 mg/dL (ref 8.6–10.3)
Chloride: 105 mmol/L (ref 98–110)
Creat: 0.92 mg/dL (ref 0.60–1.35)
GFR, Est African American: 116 mL/min/{1.73_m2} (ref >=60)
GFR, Est Non African American: 100 mL/min/{1.73_m2} (ref >=60)
Globulin: 2.3 g/dL (calc) (ref 1.9–3.7)
Glucose, Bld: 90 mg/dL (ref 65–99)
Potassium: 4.1 mmol/L (ref 3.5–5.3)
Sodium: 140 mmol/L (ref 135–146)
Total Bilirubin: 2 mg/dL — ABNORMAL HIGH (ref 0.2–1.2)
Total Protein: 6.8 g/dL (ref 6.1–8.1)

## 2020-05-08 LAB — LIPID PANEL W/REFLEX DIRECT LDL
Cholesterol: 172 mg/dL (ref <200)
HDL: 44 mg/dL (ref >=40)
LDL Cholesterol (Calc): 95 mg/dL (calc)
Non-HDL Cholesterol (Calc): 128 mg/dL (calc) (ref <130)
Total CHOL/HDL Ratio: 3.9 (calc) (ref <5.0)
Triglycerides: 253 mg/dL — ABNORMAL HIGH (ref <150)

## 2020-05-08 LAB — CBC
HCT: 47 % (ref 38.5–50.0)
Hemoglobin: 16.2 g/dL (ref 13.2–17.1)
MCHC: 34.5 g/dL (ref 32.0–36.0)
MCV: 90.6 fL (ref 80.0–100.0)
MPV: 9.1 fL (ref 7.5–12.5)
Platelets: 193 10*3/uL (ref 140–400)
RDW: 12.1 % (ref 11.0–15.0)

## 2020-05-13 ENCOUNTER — Ambulatory Visit (INDEPENDENT_AMBULATORY_CARE_PROVIDER_SITE_OTHER): Payer: BC Managed Care – PPO | Admitting: Physician Assistant

## 2020-05-13 ENCOUNTER — Encounter: Payer: Self-pay | Admitting: Physician Assistant

## 2020-05-13 VITALS — BP 118/76 | HR 69 | Ht 68.75 in | Wt 200.0 lb

## 2020-05-13 DIAGNOSIS — Z0000 Encounter for general adult medical examination without abnormal findings: Secondary | ICD-10-CM | POA: Diagnosis not present

## 2020-05-13 DIAGNOSIS — Z23 Encounter for immunization: Secondary | ICD-10-CM | POA: Diagnosis not present

## 2020-05-13 DIAGNOSIS — Z1211 Encounter for screening for malignant neoplasm of colon: Secondary | ICD-10-CM | POA: Diagnosis not present

## 2020-05-13 DIAGNOSIS — E781 Pure hyperglyceridemia: Secondary | ICD-10-CM | POA: Diagnosis not present

## 2020-05-13 DIAGNOSIS — F5101 Primary insomnia: Secondary | ICD-10-CM | POA: Insufficient documentation

## 2020-05-13 NOTE — Progress Notes (Signed)
Subjective:    Patient ID: Donald Gillespie, male    DOB: Jan 14, 1976, 44 y.o.   MRN: [id number]  HPI  Pt is a 44 yo male who presents to the clinic for CPE.   He would like to discuss labs.   He is having some sleep issues. He has problems going and staying asleep. Denies any snoring or apnea events. Wakes up not feeling rested. Melatonin helps go to sleep but leaves him feeling groggy.   .. Active Ambulatory Problems    Diagnosis Date Noted  . Anxiety 09/01/2010  . ASTHMA 09/01/2010  . 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  . Right shoulder pain 08/28/2011  . Mild intermittent asthma without complication 03/12/2020  . Allergies 03/12/2020  . 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 Hypertriglyceridemia 05/13/2020   Resolved Ambulatory Problems    Diagnosis Date Noted  . No Resolved Ambulatory Problems   Past Medical History:  Diagnosis Date  . Asthma   . Depression   . Seasonal allergies    .Marland Kitchen Family History  Problem Relation Age of Onset  . Hypertension Mother   . Breast cancer Other   . Cancer Other   . Diabetes Neg Hx   . Heart attack Neg Hx   . Hyperlipidemia Neg Hx    .Marland Kitchen Social History   Socioeconomic History  . Marital status: Married    Spouse name: Not on file  . Number of children: Not on file  . Years of education: Not on file  . Highest education level: Not on file  Occupational History  . Not on file  Tobacco Use  . Smoking status: Never Smoker  . Smokeless tobacco: Never Used  Vaping Use  . Vaping Use: Never used  Substance and Sexual Activity  . Alcohol use: Yes    Comment: 3 drinks a week  . Drug use: No  . Sexual activity: Yes    Partners: Female    Birth control/protection: Surgical  Other Topics Concern  . Not on file  Social History Narrative  . Not on file   Social Determinants of Health   Financial Resource Strain:   . Difficulty of Paying Living Expenses: Not on file  Food Insecurity:   . Worried About Programme researcher, broadcasting/film/video in the Last  Year: Not on file  . Ran Out of Food in the Last Year: Not on file  Transportation Needs:   . Lack of Transportation (Medical): Not on file  . Lack of Transportation (Non-Medical): Not on file  Physical Activity:   . Days of Exercise per Week: Not on file  . Minutes of Exercise per Session: Not on file  Stress:   . Feeling of Stress : Not on file  Social Connections:   . Frequency of Communication with Friends and Family: Not on file  . Frequency of Social Gatherings with Friends and Family: Not on file  . Attends Religious Services: Not on file  . Active Member of Clubs or Organizations: Not on file  . Attends Banker Meetings: Not on file  . Marital Status: Not on file  Intimate Partner Violence:   . Fear of Current or Ex-Partner: Not on file  . Emotionally Abused: Not on file  . Physically Abused: Not on file  . Sexually Abused: Not on file    Review of Systems  All other systems reviewed and are negative.      Objective:   Physical Exam Vitals reviewed.  Cardiovascular:     Rate and  Rhythm: Normal rate.   BP 118/76   Pulse 69   Ht 5' 8.75" (1.746 m)   Wt 200 lb (90.7 kg)   SpO2 98%   BMI 29.75 kg/m   General Appearance:    Alert, cooperative, no distress, appears stated age  Head:    Normocephalic, without obvious abnormality, atraumatic  Eyes:    PERRL, conjunctiva/corneas clear, EOM's intact, fundi    benign, both eyes       Ears:    Normal TM's and external ear canals, both ears  Nose:   Nares normal, septum midline, mucosa normal, no drainage    or sinus tenderness  Throat:   Lips, mucosa, and tongue normal; teeth and gums normal  Neck:   Supple, symmetrical, trachea midline, no adenopathy;       thyroid:  No enlargement/tenderness/nodules; no carotid   bruit or JVD  Back:     Symmetric, no curvature, ROM normal, no CVA tenderness  Lungs:     Clear to auscultation bilaterally, respirations unlabored  Chest wall:    No tenderness or deformity   Heart:    Regular rate and rhythm, S1 and S2 normal, no murmur, rub   or gallop  Abdomen:     Soft, non-tender, bowel sounds active all four quadrants,    no masses, no organomegaly        Extremities:   Extremities normal, atraumatic, no cyanosis or edema  Pulses:   2+ and symmetric all extremities  Skin:   Skin color, texture, turgor normal, no rashes or lesions  Lymph nodes:   Cervical, supraclavicular, and axillary nodes normal  Neurologic:   CNII-XII intact. Normal strength, sensation and reflexes      throughout    .Marland Kitchen Depression screen [name] 2/9 05/13/2020 03/12/2020  Decreased Interest 1 1  Down, Depressed, Hopeless 1 1  PHQ - 2 Score 2 2  Altered sleeping 2 2  Tired, decreased energy 1 2  Change in appetite 2 0  Feeling bad or failure about yourself  1 0  Trouble concentrating 0 0  Moving slowly or fidgety/restless 0 0  Suicidal thoughts 0 0  PHQ-9 Score 8 6  Difficult doing work/chores Somewhat difficult Not difficult at all   .Marland Kitchen GAD 7 : Generalized Anxiety Score 05/13/2020 03/12/2020  Nervous, Anxious, on Edge 1 0  Control/stop worrying 1 0  Worry too much - different things 1 0  Trouble relaxing 1 2  Restless 0 0  Easily annoyed or irritable 0 0  Afraid - awful might happen 0 0  Total GAD 7 Score 4 2  Anxiety Difficulty Not difficult at all Not difficult at all          Assessment & Plan:  .Marland KitchenFlorian was seen today for annual exam.  Diagnoses and all orders for this visit:  Routine physical examination  Hypertriglyceridemia  [place]one syndrome  Colon cancer screening -     Ambulatory referral to Gastroenterology  Need for immunization against influenza -     Flu Vaccine QUAD 36+ mos IM   .Marland Kitchen Discussed 150 minutes of exercise a week.  Encouraged vitamin D 1000 units and Calcium 1300mg  or 4 servings of dairy a day.  Fasting labs done.  Elevated TG. Discussed diet. HO given. Start fish oil 2000-4000mg  daily.  Colonoscopy ordered due to new  guidelines at 44.  Flu shot given.   Discussed sleep hygiene. HO given. Discussed no blue lights 2 hours before bed, calming apps. Discussed belsomnra. He  will consider.

## 2020-05-13 NOTE — Patient Instructions (Addendum)
Health Maintenance, Male Adopting a healthy lifestyle and getting preventive care are important in promoting health and wellness. Ask your health care provider about:  The right schedule for you to have regular tests and exams.  Things you can do on your own to prevent diseases and keep yourself healthy. What should I know about diet, weight, and exercise? Eat a healthy diet   Eat a diet that includes plenty of vegetables, fruits, low-fat dairy products, and lean protein.  Do not eat a lot of foods that are high in solid fats, added sugars, or sodium. Maintain a healthy weight Body mass index (BMI) is a measurement that can be used to identify possible weight problems. It estimates body fat based on height and weight. Your health care provider can help determine your BMI and help you achieve or maintain a healthy weight. Get regular exercise Get regular exercise. This is one of the most important things you can do for your health. Most adults should:  Exercise for at least 150 minutes each week. The exercise should increase your heart rate and make you sweat (moderate-intensity exercise).  Do strengthening exercises at least twice a week. This is in addition to the moderate-intensity exercise.  Spend less time sitting. Even light physical activity can be beneficial. Watch cholesterol and blood lipids Have your blood tested for lipids and cholesterol at 45 years of age, then have this test every 5 years. You may need to have your cholesterol levels checked more often if:  Your lipid or cholesterol levels are high.  You are older than 45 years of age.  You are at high risk for heart disease. What should I know about cancer screening? Many types of cancers can be detected early and may often be prevented. Depending on your health history and family history, you may need to have cancer screening at various ages. This may include screening for:  Colorectal cancer.  Prostate  cancer.  Skin cancer.  Lung cancer. What should I know about heart disease, diabetes, and high blood pressure? Blood pressure and heart disease  High blood pressure causes heart disease and increases the risk of stroke. This is more likely to develop in people who have high blood pressure readings, are of African descent, or are overweight.  Talk with your health care provider about your target blood pressure readings.  Have your blood pressure checked: ? Every 3-5 years if you are 18-39 years of age. ? Every year if you are 40 years old or older.  If you are between the ages of 65 and 75 and are a current or former smoker, ask your health care provider if you should have a one-time screening for abdominal aortic aneurysm (AAA). Diabetes Have regular diabetes screenings. This checks your fasting blood sugar level. Have the screening done:  Once every three years after age 45 if you are at a normal weight and have a low risk for diabetes.  More often and at a younger age if you are overweight or have a high risk for diabetes. What should I know about preventing infection? Hepatitis B If you have a higher risk for hepatitis B, you should be screened for this virus. Talk with your health care provider to find out if you are at risk for hepatitis B infection. Hepatitis C Blood testing is recommended for:  Everyone born from 1945 through 1965.  Anyone with known risk factors for hepatitis C. Sexually transmitted infections (STIs)  You should be screened each year   for STIs, including gonorrhea and chlamydia, if: ? You are sexually active and are younger than 45 years of age. ? You are older than 45 years of age and your health care provider tells you that you are at risk for this type of infection. ? Your sexual activity has changed since you were last screened, and you are at increased risk for chlamydia or gonorrhea. Ask your health care provider if you are at risk.  Ask your  health care provider about whether you are at high risk for HIV. Your health care provider may recommend a prescription medicine to help prevent HIV infection. If you choose to take medicine to prevent HIV, you should first get tested for HIV. You should then be tested every 3 months for as long as you are taking the medicine. Follow these instructions at home: Lifestyle  Do not use any products that contain nicotine or tobacco, such as cigarettes, e-cigarettes, and chewing tobacco. If you need help quitting, ask your health care provider.  Do not use street drugs.  Do not share needles.  Ask your health care provider for help if you need support or information about quitting drugs. Alcohol use  Do not drink alcohol if your health care provider tells you not to drink.  If you drink alcohol: ? Limit how much you have to 0-2 drinks a day. ? Be aware of how much alcohol is in your drink. In the [place] one drink equals one 12 oz bottle of beer (355 mL), one 5 oz glass of wine (148 mL), or one 1 oz glass of hard liquor (44 mL). General instructions  Schedule regular health, dental, and eye exams.  Stay current with your vaccines.  Tell your health care provider if: ? You often feel depressed. ? You have ever been abused or do not feel safe at home. Summary  Adopting a healthy lifestyle and getting preventive care are important in promoting health and wellness.  Follow your health care provider's instructions about healthy diet, exercising, and getting tested or screened for diseases.  Follow your health care provider's instructions on monitoring your cholesterol and blood pressure. This information is not intended to replace advice given to you by your health care provider. Make sure you discuss any questions you have with your health care provider. Document Revised: 07/06/2018 Document Reviewed: 07/06/2018 Elsevier Patient Education  2020 Elsevier Inc. High Triglycerides Eating  Plan Triglycerides are a type of fat in the blood. High levels of triglycerides can increase your risk of heart disease and stroke. If your triglyceride levels are high, choosing the right foods can help lower your triglycerides and keep your heart healthy. Work with your health care provider or a diet and nutrition specialist (dietitian) to develop an eating plan that is right for you. What are tips for following this plan? General guidelines   Lose weight, if you are overweight. For most people, losing 5-10 lbs (2-5 kg) helps lower triglyceride levels. A weight-loss plan may include. ? 30 minutes of exercise at least 5 days a week. ? Reducing the amount of calories, sugar, and fat you eat.  Eat a wide variety of fresh fruits, vegetables, and whole grains. These foods are high in fiber.  Eat foods that contain healthy fats, such as fatty fish, nuts, seeds, and olive oil.  Avoid foods that are high in added sugar, added salt (sodium), saturated fat, and trans fat.  Avoid low-fiber, refined carbohydrates such as white bread, crackers, noodles, and white  rice.  Avoid foods with partially hydrogenated oils (trans fats), such as fried foods or stick margarine.  Limit alcohol intake to no more than 1 drink a day for nonpregnant women and 2 drinks a day for men. One drink equals 12 oz of beer, 5 oz of wine, or 1 oz of hard liquor. Your health care provider may recommend that you drink less depending on your overall health. Reading food labels  Check food labels for the amount of saturated fat. Choose foods with no or very little saturated fat.  Check food labels for the amount of trans fat. Choose foods with no trans fat.  Check food labels for the amount of cholesterol. Choose foods low in cholesterol. Ask your dietitian how much cholesterol you should have each day.  Check food labels for the amount of sodium. Choose foods with less than 140 milligrams (mg) per serving. Shopping  Buy  dairy products labeled as nonfat (skim) or low-fat (1%).  Avoid buying processed or prepackaged foods. These are often high in added sugar, sodium, and fat. Cooking  Choose healthy fats when cooking, such as olive oil or canola oil.  Cook foods using lower fat methods, such as baking, broiling, boiling, or grilling.  Make your own sauces, dressings, and marinades when possible, instead of buying them. Store-bought sauces, dressings, and marinades are often high in sodium and sugar. Meal planning  Eat more home-cooked food and less restaurant, buffet, and fast food.  Eat fatty fish at least 2 times each week. Examples of fatty fish include salmon, trout, mackerel, tuna, and herring.  If you eat whole eggs, do not eat more than 3 egg yolks per week. What foods are recommended? The items listed may not be a complete list. Talk with your dietitian about what dietary choices are best for you. Grains Whole wheat or whole grain breads, crackers, cereals, and pasta. Unsweetened oatmeal. Bulgur. Barley. Quinoa. Brown rice. Whole wheat flour tortillas. Vegetables Fresh or frozen vegetables. Low-sodium canned vegetables. Fruits All fresh, canned (in natural juice), or frozen fruits. Meats and other protein foods Skinless chicken or [place]. Ground chicken or [place]. Lean cuts of pork, trimmed of fat. Fish and seafood, especially salmon, trout, and herring. Egg whites. Dried beans, peas, or lentils. Unsalted nuts or seeds. Unsalted canned beans. Natural peanut or almond butter. Dairy Low-fat dairy products. Skim or low-fat (1%) milk. Reduced fat (2%) and low-sodium cheese. Low-fat ricotta cheese. Low-fat cottage cheese. Plain, low-fat yogurt. Fats and oils Tub margarine without trans fats. Light or reduced-fat mayonnaise. Light or reduced-fat salad dressings. Avocado. Safflower, olive, sunflower, soybean, and canola oils. What foods are not recommended? The items listed may not be a complete list.  Talk with your dietitian about what dietary choices are best for you. Grains White bread. White (regular) pasta. White rice. Cornbread. Bagels. Pastries. Crackers that contain trans fat. Vegetables Creamed or fried vegetables. Vegetables in a cheese sauce. Fruits Sweetened dried fruit. Canned fruit in syrup. Fruit juice. Meats and other protein foods Fatty cuts of meat. Ribs. Chicken wings. Tomasa Blase. Sausage. Bologna. Salami. Chitterlings. Fatback. Hot dogs. Bratwurst. Packaged lunch meats. Dairy Whole or reduced-fat (2%) milk. Half-and-half. Cream cheese. Full-fat or sweetened yogurt. Full-fat cheese. Nondairy creamers. Whipped toppings. Processed cheese or cheese spreads. Cheese curds. Beverages Alcohol. Sweetened drinks, such as soda, lemonade, fruit drinks, or punches. Fats and oils Butter. Stick margarine. Lard. Shortening. Ghee. Bacon fat. Tropical oils, such as coconut, palm kernel, or palm oils. Sweets and desserts Corn syrup.  Sugars. Honey. Molasses. Candy. Jam and jelly. Syrup. Sweetened cereals. Cookies. Pies. Cakes. Donuts. Muffins. Ice cream. Condiments Store-bought sauces, dressings, and marinades that are high in sugar, such as ketchup and barbecue sauce. Summary  High levels of triglycerides can increase the risk of heart disease and stroke. Choosing the right foods can help lower your triglycerides.  Eat plenty of fresh fruits, vegetables, and whole grains. Choose low-fat dairy and lean meats. Eat fatty fish at least twice a week.  Avoid processed and prepackaged foods with added sugar, sodium, saturated fat, and trans fat.  If you need suggestions or have questions about what types of food are good for you, talk with your health care provider or a dietitian. This information is not intended to replace advice given to you by your health care provider. Make sure you discuss any questions you have with your health care provider. Document Revised: 06/25/2017 Document Reviewed:  09/15/2016 Elsevier Patient Education  2020 Elsevier Inc.   Insomnia Insomnia is a sleep disorder that makes it difficult to fall asleep or stay asleep. Insomnia can cause fatigue, low energy, difficulty concentrating, mood swings, and poor performance at work or school. There are three different ways to classify insomnia:  Difficulty falling asleep.  Difficulty staying asleep.  Waking up too early in the morning. Any type of insomnia can be long-term (chronic) or short-term (acute). Both are common. Short-term insomnia usually lasts for three months or less. Chronic insomnia occurs at least three times a week for longer than three months. What are the causes? Insomnia may be caused by another condition, situation, or substance, such as:  Anxiety.  Certain medicines.  Gastroesophageal reflux disease (GERD) or other gastrointestinal conditions.  Asthma or other breathing conditions.  Restless legs syndrome, sleep apnea, or other sleep disorders.  Chronic pain.  Menopause.  Stroke.  Abuse of alcohol, tobacco, or illegal drugs.  Mental health conditions, such as depression.  Caffeine.  Neurological disorders, such as Alzheimer's disease.  An overactive thyroid (hyperthyroidism). Sometimes, the cause of insomnia may not be known. What increases the risk? Risk factors for insomnia include:  Gender. Women are affected more often than men.  Age. Insomnia is more common as you get older.  Stress.  Lack of exercise.  Irregular work schedule or working night shifts.  Traveling between different time zones.  Certain medical and mental health conditions. What are the signs or symptoms? If you have insomnia, the main symptom is having trouble falling asleep or having trouble staying asleep. This may lead to other symptoms, such as:  Feeling fatigued or having low energy.  Feeling nervous about going to sleep.  Not feeling rested in the morning.  Having trouble  concentrating.  Feeling irritable, anxious, or depressed. How is this diagnosed? This condition may be diagnosed based on:  Your symptoms and medical history. Your health care provider may ask about: ? Your sleep habits. ? Any medical conditions you have. ? Your mental health.  A physical exam. How is this treated? Treatment for insomnia depends on the cause. Treatment may focus on treating an underlying condition that is causing insomnia. Treatment may also include:  Medicines to help you sleep.  Counseling or therapy.  Lifestyle adjustments to help you sleep better. Follow these instructions at home: Eating and drinking   Limit or avoid alcohol, caffeinated beverages, and cigarettes, especially close to bedtime. These can disrupt your sleep.  Do not eat a large meal or eat spicy foods right before bedtime. This  can lead to digestive discomfort that can make it hard for you to sleep. Sleep habits   Keep a sleep diary to help you and your health care provider figure out what could be causing your insomnia. Write down: ? When you sleep. ? When you wake up during the night. ? How well you sleep. ? How rested you feel the next day. ? Any side effects of medicines you are taking. ? What you eat and drink.  Make your bedroom a dark, comfortable place where it is easy to fall asleep. ? Put up shades or blackout curtains to block light from outside. ? Use a white noise machine to block noise. ? Keep the temperature cool.  Limit screen use before bedtime. This includes: ? Watching TV. ? Using your smartphone, tablet, or computer.  Stick to a routine that includes going to bed and waking up at the same times every day and night. This can help you fall asleep faster. Consider making a quiet activity, such as reading, part of your nighttime routine.  Try to avoid taking naps during the day so that you sleep better at night.  Get out of bed if you are still awake after 15  minutes of trying to sleep. Keep the lights down, but try reading or doing a quiet activity. When you feel sleepy, go back to bed. General instructions  Take over-the-counter and prescription medicines only as told by your health care provider.  Exercise regularly, as told by your health care provider. Avoid exercise starting several hours before bedtime.  Use relaxation techniques to manage stress. Ask your health care provider to suggest some techniques that may work well for you. These may include: ? Breathing exercises. ? Routines to release muscle tension. ? Visualizing peaceful scenes.  Make sure that you drive carefully. Avoid driving if you feel very sleepy.  Keep all follow-up visits as told by your health care provider. This is important. Contact a health care provider if:  You are tired throughout the day.  You have trouble in your daily routine due to sleepiness.  You continue to have sleep problems, or your sleep problems get worse. Get help right away if:  You have serious thoughts about hurting yourself or someone else. If you ever feel like you may hurt yourself or others, or have thoughts about taking your own life, get help right away. You can go to your nearest emergency department or call:  Your local emergency services (911 in the [place]  A suicide crisis helpline, such as the National Suicide Prevention Lifeline at (339)447-35651-(917)272-5023. This is open 24 hours a day. Summary  Insomnia is a sleep disorder that makes it difficult to fall asleep or stay asleep.  Insomnia can be long-term (chronic) or short-term (acute).  Treatment for insomnia depends on the cause. Treatment may focus on treating an underlying condition that is causing insomnia.  Keep a sleep diary to help you and your health care provider figure out what could be causing your insomnia. This information is not intended to replace advice given to you by your health care provider. Make sure you discuss  any questions you have with your health care provider. Document Revised: 06/25/2017 Document Reviewed: 04/22/2017 Elsevier Patient Education  2020 [employer]

## 2020-06-04 ENCOUNTER — Encounter: Payer: Self-pay | Admitting: Physician Assistant

## 2020-06-04 DIAGNOSIS — R040 Epistaxis: Secondary | ICD-10-CM

## 2020-07-29 ENCOUNTER — Ambulatory Visit (INDEPENDENT_AMBULATORY_CARE_PROVIDER_SITE_OTHER): Payer: BC Managed Care – PPO | Admitting: Otolaryngology

## 2020-07-29 ENCOUNTER — Other Ambulatory Visit: Payer: Self-pay

## 2020-07-29 ENCOUNTER — Encounter (INDEPENDENT_AMBULATORY_CARE_PROVIDER_SITE_OTHER): Payer: Self-pay | Admitting: Otolaryngology

## 2020-07-29 VITALS — Temp 97.2°F

## 2020-07-29 DIAGNOSIS — J342 Deviated nasal septum: Secondary | ICD-10-CM | POA: Diagnosis not present

## 2020-07-29 DIAGNOSIS — R040 Epistaxis: Secondary | ICD-10-CM | POA: Diagnosis not present

## 2020-07-29 NOTE — Progress Notes (Signed)
HPI: Donald Gillespie is a 45 y.o. male who presents is referred by his PCP for evaluation of recurrent epistaxis.  He apparently has had a several year history of recurrent right-sided nosebleeds.  He has had previous cauterization with [facility] several times before.  He states that he always has a scab on the right side and has intermittent bleeding which is generally small. Of note he had a nasal fracture a couple years ago that was treated with closed reduction nasal fracture. He does not complain of any difficulty breathing through his nose.Marland Kitchen  Past Medical History:  Diagnosis Date  . Anxiety   . Asthma   . Depression   . Seasonal allergies    Past Surgical History:  Procedure Laterality Date  . CHOLECYSTECTOMY    . VASECTOMY     Social History   Socioeconomic History  . Marital status: Married    Spouse name: Not on file  . Number of children: Not on file  . Years of education: Not on file  . Highest education level: Not on file  Occupational History  . Not on file  Tobacco Use  . Smoking status: Never Smoker  . Smokeless tobacco: Never Used  Vaping Use  . Vaping Use: Never used  Substance and Sexual Activity  . Alcohol use: Yes    Comment: 3 drinks a week  . Drug use: No  . Sexual activity: Yes    Partners: Female    Birth control/protection: Surgical  Other Topics Concern  . Not on file  Social History Narrative  . Not on file   Social Determinants of Health   Financial Resource Strain: Not on file  Food Insecurity: Not on file  Transportation Needs: Not on file  Physical Activity: Not on file  Stress: Not on file  Social Connections: Not on file   Family History  Problem Relation Age of Onset  . Hypertension Mother   . Breast cancer Other   . Cancer Other   . Diabetes Neg Hx   . Heart attack Neg Hx   . Hyperlipidemia Neg Hx    Allergies  Allergen Reactions  . Clindamycin/Lincomycin Nausea And Vomiting  . Amoxicillin-Pot Clavulanate Diarrhea   . Montelukast Sodium   . Promethazine Hcl    Prior to Admission medications   Medication Sig Start Date End Date Taking? Authorizing Provider  albuterol (PROVENTIL HFA;VENTOLIN HFA) 108 (90 BASE) MCG/ACT inhaler Inhale 2 puffs into the lungs every 6 (six) hours as needed for wheezing. 03/02/12 03/12/20  Lattie Haw, MD  cetirizine (ZYRTEC) 10 MG tablet Take 10 mg by mouth daily.      [provider]  clonazePAM (KLONOPIN) 0.5 MG tablet Take 0.5 mg by mouth 2 (two) times daily as needed.      [provider]  fluticasone (FLONASE) 50 MCG/ACT nasal spray Place 2 sprays into both nostrils daily.    [provider]     Positive ROS: Otherwise negative  All other systems have been reviewed and were otherwise negative with the exception of those mentioned in the HPI and as above.  Physical Exam: Constitutional: Alert, well-appearing, no acute distress Ears: External ears without lesions or tenderness. Ear canals are clear bilaterally with intact, clear TMs.  Nasal: External nose without lesions. Septum is deviated to the left with a concavity on the right side..  He has some crusting on the right side of the septum and a small amount of granulation tissue where he had some  bleeding that was fairly minor on the right side of the septum.  This was cauterized using silver nitrate.  Remaining nasal passageway was clear. Oral: Lips and gums without lesions. Tongue and palate mucosa without lesions. Posterior oropharynx clear. Neck: No palpable adenopathy or masses Respiratory: Breathing comfortably  Skin: No facial/neck lesions or rash noted.  Control of epistaxis  Date/Time: 07/29/2020 7:26 PM Performed by: Drema Halon, MD Authorized by: Drema Halon, MD   Consent:    Consent obtained:  Verbal   Consent given by:  Patient   Risks discussed:  Bleeding and pain   Alternatives discussed:  No treatment and observation Procedure details:     Treatment site:  R anterior   Treatment method:  Silver nitrate   Treatment complexity:  Limited   Treatment episode: initial   Post-procedure details:    Patient tolerance of procedure:  Tolerated well, no immediate complications Comments:     Patient had a prominent right mid anterior septal vessel and granulation tissue that was cauterized using silver nitrate.    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  Plan: Right side of septum was cauterized using silver nitrate in the office today. Prescribed mupirocin 2% ointment to use if he has much crusting on the right side after 2 weeks. He will follow-up as needed. Briefly discussed option of septoplasty if he continues to have recurrent nosebleeds with placement of Silastic stents for 6 weeks. He will follow-up as needed.   Narda Bonds, MD   CC:

## 2020-11-11 ENCOUNTER — Ambulatory Visit: Payer: BC Managed Care – PPO | Admitting: Physician Assistant

## 2020-11-20 ENCOUNTER — Ambulatory Visit: Payer: BC Managed Care – PPO | Admitting: Physician Assistant

## 2020-12-30 ENCOUNTER — Encounter: Payer: Self-pay | Admitting: Physician Assistant

## 2020-12-31 MED ORDER — ALBUTEROL SULFATE HFA 108 (90 BASE) MCG/ACT IN AERS: 2.0000 | INHALATION_SPRAY | Freq: Four times a day (QID) | RESPIRATORY_TRACT | 5 refills | Status: DC | PRN

## 2021-02-26 ENCOUNTER — Ambulatory Visit (INDEPENDENT_AMBULATORY_CARE_PROVIDER_SITE_OTHER): Payer: BC Managed Care – PPO

## 2021-02-26 ENCOUNTER — Ambulatory Visit (INDEPENDENT_AMBULATORY_CARE_PROVIDER_SITE_OTHER): Payer: BC Managed Care – PPO | Admitting: Physician Assistant

## 2021-02-26 ENCOUNTER — Other Ambulatory Visit: Payer: Self-pay

## 2021-02-26 ENCOUNTER — Encounter: Payer: Self-pay | Admitting: Physician Assistant

## 2021-02-26 VITALS — BP 123/73 | HR 53 | Temp 98.0°F | Ht 68.75 in | Wt 203.0 lb

## 2021-02-26 DIAGNOSIS — M79674 Pain in right toe(s): Secondary | ICD-10-CM | POA: Diagnosis not present

## 2021-02-26 DIAGNOSIS — B351 Tinea unguium: Secondary | ICD-10-CM | POA: Insufficient documentation

## 2021-02-26 MED ORDER — TERBINAFINE HCL 250 MG PO TABS: 250.0000 mg | ORAL_TABLET | Freq: Every day | ORAL | 1 refills | Status: DC

## 2021-02-26 MED ORDER — MELOXICAM 15 MG PO TABS: 15.0000 mg | ORAL_TABLET | Freq: Every day | ORAL | 1 refills | Status: DC

## 2021-02-26 NOTE — Progress Notes (Signed)
   Subjective:    Patient ID: Donald Gillespie, male    DOB: 24-Mar-1976, 45 y.o.   MRN: [id number]  HPI Pt is a 45 yo male who presents to the clinic with great right toe pain for the last 2 months intermittently. Feels like it is getting more frequent and worse. Pain is associated with any flexion/extension of toes. No known injury. He has started back running but does not hurt him while he is running. Not tried anything to make better.    .. 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  Objective:   Physical Exam Vitals reviewed.  Constitutional:      Appearance: Normal appearance.  Musculoskeletal:     Comments: Multiple toenails thick and dystrophic of both feet.   Right great toe PIP joint tenderness to palpation. No redness, swelling, warmth. ROM of great toe normal but pain worse with flexion and extension of toe.   Neurological:     Mental Status: He is alert.          Assessment & Plan:  .Marland [place] was seen today for toe pain.  Diagnoses and all orders for this visit:  Great toe pain, right -     DG Toe Great Right; Future -     meloxicam (MOBIC) 15 MG tablet; Take 1 tablet (15 mg total) by mouth daily.  Onychomycosis -     terbinafine (LAMISIL) 250 MG tablet; Take 1 tablet (250 mg total) by mouth daily.  Suspect some osteoarthritis of right great toe. No signs of infection or gout. Mobic to use as  needed for pain. Will get xray.   Lamisil for 6 months for fungus.

## 2021-02-28 NOTE — Progress Notes (Signed)
No fractures or erosions. No significant arthritis seen either. If the 5-7 days of mobic does not help. I would make appt with podiatry(can make appt if needed) to discuss other treatment plans.

## 2021-04-28 ENCOUNTER — Encounter: Payer: Self-pay | Admitting: Physician Assistant

## 2021-04-29 ENCOUNTER — Encounter: Payer: Self-pay | Admitting: Physician Assistant

## 2021-04-29 ENCOUNTER — Ambulatory Visit (INDEPENDENT_AMBULATORY_CARE_PROVIDER_SITE_OTHER): Payer: BC Managed Care – PPO | Admitting: Physician Assistant

## 2021-04-29 VITALS — BP 133/82 | HR 62 | Ht 68.75 in | Wt 212.0 lb

## 2021-04-29 DIAGNOSIS — E781 Pure hyperglyceridemia: Secondary | ICD-10-CM

## 2021-04-29 DIAGNOSIS — E6609 Other obesity due to excess calories: Secondary | ICD-10-CM

## 2021-04-29 DIAGNOSIS — Z1329 Encounter for screening for other suspected endocrine disorder: Secondary | ICD-10-CM

## 2021-04-29 DIAGNOSIS — Z0000 Encounter for general adult medical examination without abnormal findings: Secondary | ICD-10-CM

## 2021-04-29 DIAGNOSIS — Z23 Encounter for immunization: Secondary | ICD-10-CM

## 2021-04-29 DIAGNOSIS — Z131 Encounter for screening for diabetes mellitus: Secondary | ICD-10-CM | POA: Diagnosis not present

## 2021-04-29 DIAGNOSIS — Z6831 Body mass index (BMI) 31.0-31.9, adult: Secondary | ICD-10-CM

## 2021-04-29 NOTE — Patient Instructions (Addendum)
Plenity capsules before lunch and dinner for weight loss and fullness.   Health Maintenance, Male Adopting a healthy lifestyle and getting preventive care are important in promoting health and wellness. Ask your health care provider about: The right schedule for you to have regular tests and exams. Things you can do on your own to prevent diseases and keep yourself healthy. What should I know about diet, weight, and exercise? Eat a healthy diet  Eat a diet that includes plenty of vegetables, fruits, low-fat dairy products, and lean protein. Do not eat a lot of foods that are high in solid fats, added sugars, or sodium. Maintain a healthy weight Body mass index (BMI) is a measurement that can be used to identify possible weight problems. It estimates body fat based on height and weight. Your health care provider can help determine your BMI and help you achieve or maintain a healthy weight. Get regular exercise Get regular exercise. This is one of the most important things you can do for your health. Most adults should: Exercise for at least 150 minutes each week. The exercise should increase your heart rate and make you sweat (moderate-intensity exercise). Do strengthening exercises at least twice a week. This is in addition to the moderate-intensity exercise. Spend less time sitting. Even light physical activity can be beneficial. Watch cholesterol and blood lipids Have your blood tested for lipids and cholesterol at 46 years of age, then have this test every 5 years. You may need to have your cholesterol levels checked more often if: Your lipid or cholesterol levels are high. You are older than 46 years of age. You are at high risk for heart disease. What should I know about cancer screening? Many types of cancers can be detected early and may often be prevented. Depending on your health history and family history, you may need to have cancer screening at various ages. This may include  screening for: Colorectal cancer. Prostate cancer. Skin cancer. Lung cancer. What should I know about heart disease, diabetes, and high blood pressure? Blood pressure and heart disease High blood pressure causes heart disease and increases the risk of stroke. This is more likely to develop in people who have high blood pressure readings, are of African descent, or are overweight. Talk with your health care provider about your target blood pressure readings. Have your blood pressure checked: Every 3-5 years if you are 61-74 years of age. Every year if you are 79 years old or older. If you are between the ages of 25 and 25 and are a current or former smoker, ask your health care provider if you should have a one-time screening for abdominal aortic aneurysm (AAA). Diabetes Have regular diabetes screenings. This checks your fasting blood sugar level. Have the screening done: Once every three years after age 48 if you are at a normal weight and have a low risk for diabetes. More often and at a younger age if you are overweight or have a high risk for diabetes. What should I know about preventing infection? Hepatitis B If you have a higher risk for hepatitis B, you should be screened for this virus. Talk with your health care provider to find out if you are at risk for hepatitis B infection. Hepatitis C Blood testing is recommended for: Everyone born from [birth year] through 1966. Anyone with known risk factors for hepatitis C. Sexually transmitted infections (STIs) You should be screened each year for STIs, including gonorrhea and chlamydia, if: You are sexually active and  are younger than 46 years of age. You are older than 46 years of age and your health care provider tells you that you are at risk for this type of infection. Your sexual activity has changed since you were last screened, and you are at increased risk for chlamydia or gonorrhea. Ask your health care provider if you are at risk. Ask  your health care provider about whether you are at high risk for HIV. Your health care provider may recommend a prescription medicine to help prevent HIV infection. If you choose to take medicine to prevent HIV, you should first get tested for HIV. You should then be tested every 3 months for as long as you are taking the medicine. Follow these instructions at home: Lifestyle Do not use any products that contain nicotine or tobacco, such as cigarettes, e-cigarettes, and chewing tobacco. If you need help quitting, ask your health care provider. Do not use street drugs. Do not share needles. Ask your health care provider for help if you need support or information about quitting drugs. Alcohol use Do not drink alcohol if your health care provider tells you not to drink. If you drink alcohol: Limit how much you have to 0-2 drinks a day. Be aware of how much alcohol is in your drink. In the [place] one drink equals one 12 oz bottle of beer (355 mL), one 5 oz glass of wine (148 mL), or one 1 oz glass of hard liquor (44 mL). General instructions Schedule regular health, dental, and eye exams. Stay current with your vaccines. Tell your health care provider if: You often feel depressed. You have ever been abused or do not feel safe at home. Summary Adopting a healthy lifestyle and getting preventive care are important in promoting health and wellness. Follow your health care provider's instructions about healthy diet, exercising, and getting tested or screened for diseases. Follow your health care provider's instructions on monitoring your cholesterol and blood pressure. This information is not intended to replace advice given to you by your health care provider. Make sure you discuss any questions you have with your health care provider. Document Revised: 09/20/2020 Document Reviewed: 07/06/2018 Elsevier Patient Education  2022 [employer].

## 2021-04-29 NOTE — Telephone Encounter (Signed)
Already seen.

## 2021-04-29 NOTE — Progress Notes (Signed)
Subjective:    Patient ID: Donald Gillespie, male    DOB: [birth year], 45 y.o.   MRN: [id number]  HPI Patient is a 45 year old obese male who presents to the clinic for complete annual physical.  He is frustrated with his weight and is actively trying to be better.  He is not currently exercising or eating right.  .. Family History  Problem Relation Age of Onset   Hypertension Mother    Breast cancer Other    Cancer Other    Diabetes Neg Hx    Heart attack Neg Hx    Hyperlipidemia Neg Hx    .Marland Kitchen Social History   Socioeconomic History   Marital status: Married    Spouse name: Not on file   Number of children: Not on file   Years of education: Not on file   Highest education level: Not on file  Occupational History   Not on file  Tobacco Use   Smoking status: Never   Smokeless tobacco: Never  Vaping Use   Vaping Use: Never used  Substance and Sexual Activity   Alcohol use: Yes    Comment: 3 drinks a week   Drug use: No   Sexual activity: Yes    Partners: Female    Birth control/protection: Surgical  Other Topics Concern   Not on file  Social History Narrative   Not on file   Social Determinants of Health   Financial Resource Strain: Not on file  Food Insecurity: Not on file  Transportation Needs: Not on file  Physical Activity: Not on file  Stress: Not on file  Social Connections: Not on file  Intimate Partner Violence: Not on file   .Marland Kitchen 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     Objective:   Physical Exam BP 133/82   Pulse 62   Ht 5' 8.75" (1.746 m)   Wt 212 lb (96.2 kg)   SpO2 99%   BMI 31.54 kg/m   General Appearance:    Alert, cooperative, no distress, appears stated age  Head:    Normocephalic, without obvious abnormality, atraumatic  Eyes:    PERRL, conjunctiva/corneas clear, EOM's intact, fundi    benign, both eyes       Ears:    Normal TM's and external ear canals, both ears  Nose:   Nares normal, septum midline, mucosa normal, no drainage    or sinus tenderness  Throat:   Lips, mucosa, and tongue normal; teeth and gums normal  Neck:   Supple, symmetrical, trachea midline, no adenopathy;       thyroid:  No enlargement/tenderness/nodules; no carotid   bruit or JVD  Back:     Symmetric, no curvature, ROM normal,  no CVA tenderness  Lungs:     Clear to auscultation bilaterally, respirations unlabored  Chest wall:    No tenderness or deformity  Heart:    Regular rate and rhythm, S1 and S2 normal, no murmur, rub   or gallop  Abdomen:     Soft, non-tender, bowel sounds active all four quadrants,    no masses, no organomegaly        Extremities:   Extremities normal, atraumatic, no cyanosis or edema  Pulses:   2+ and symmetric all extremities  Skin:   Skin color, texture, turgor normal, no rashes or lesions. Right upper eyelid area of scar tissue and slightly red and tender.   Lymph nodes:   Cervical, supraclavicular, and axillary nodes normal  Neurologic:   CNII-XII intact. Normal strength, sensation and reflexes      throughout        .Marland Kitchen Depression screen [name] 2/9 04/29/2021 05/13/2020 03/12/2020  Decreased Interest 0 1 1  Down, Depressed, Hopeless 0 1 1  PHQ - 2 Score 0 2 2   Altered sleeping - 2 2  Tired, decreased energy - 1 2  Change in appetite - 2 0  Feeling bad or failure about yourself  - 1 0  Trouble concentrating - 0 0  Moving slowly or fidgety/restless - 0 0  Suicidal thoughts - 0 0  PHQ-9 Score - 8 6  Difficult doing work/chores - Somewhat difficult Not difficult at all    Assessment & Plan:  .Marland KitchenSuede was seen today for annual exam.  Diagnoses and all orders for this visit:  Routine physical examination -     TSH -     Lipid Panel w/reflex Direct LDL -     COMPLETE METABOLIC PANEL WITH GFR -     CBC with Differential/Platelet  Hypertriglyceridemia -     Lipid Panel w/reflex Direct LDL  Screening for diabetes mellitus -     COMPLETE METABOLIC PANEL WITH GFR  Thyroid disorder screen -     TSH  Flu vaccine need -     Flu Vaccine QUAD 74mo+IM (Fluarix, Fluzone & Alfiuria Quad PF)  Class 1 obesity due to excess calories without serious comorbidity with body mass index (BMI) of 31.0 to 31.9 in adult  .Marland KitchenStart a regular exercise program and make sure you are eating a healthy diet Try to eat 4 servings of dairy a day or take a calcium supplement (500mg  twice a day). PHQ no concerns.  Fasting labs were ordered.  Declined colonoscopy.  Declined covid vaccine.  Flu shot given today.   .Discussed low carb diet with 1500 calories and 80g of protein.  Exercising at least 150 minutes a week.  My Fitness Pal could be a Marland Kitchen.  Discussed plenity capsules to help with weight loss.   He did have a area of what felt like scar tissues in corner of upper eyelide right. Keep eye on this if painful consider xray to confirm wood not stuck in it.

## 2021-04-30 ENCOUNTER — Encounter: Payer: Self-pay | Admitting: Physician Assistant

## 2021-04-30 DIAGNOSIS — E7800 Pure hypercholesterolemia, unspecified: Secondary | ICD-10-CM | POA: Insufficient documentation

## 2021-04-30 LAB — CBC WITH DIFFERENTIAL/PLATELET
Absolute Monocytes: 541 cells/uL (ref 200–950)
Basophils Absolute: 40 cells/uL (ref 0–200)
Basophils Relative: 0.6 %
Eosinophils Absolute: 297 cells/uL (ref 15–500)
Eosinophils Relative: 4.5 %
HCT: 48.4 % (ref 38.5–50.0)
Hemoglobin: 16.5 g/dL (ref 13.2–17.1)
Lymphs Abs: 1907 cells/uL (ref 850–3900)
MCH: 30.8 pg (ref 27.0–33.0)
MCHC: 34.1 g/dL (ref 32.0–36.0)
MCV: 90.5 fL (ref 80.0–100.0)
MPV: 9 fL (ref 7.5–12.5)
Monocytes Relative: 8.2 %
Neutro Abs: 3815 cells/uL (ref 1500–7800)
Neutrophils Relative %: 57.8 %
Platelets: 195 10*3/uL (ref 140–400)
RBC: 5.35 10*6/uL (ref 4.20–5.80)
RDW: 12.8 % (ref 11.0–15.0)
Total Lymphocyte: 28.9 %
WBC: 6.6 10*3/uL (ref 3.8–10.8)

## 2021-04-30 LAB — COMPLETE METABOLIC PANEL WITHOUT GFR
AG Ratio: 1.8 (calc) (ref 1.0–2.5)
ALT: 44 U/L (ref 9–46)
AST: 24 U/L (ref 10–40)
Albumin: 4.6 g/dL (ref 3.6–5.1)
Alkaline phosphatase (APISO): 64 U/L (ref 36–130)
BUN: 15 mg/dL (ref 7–25)
CO2: 28 mmol/L (ref 20–32)
Calcium: 9.7 mg/dL (ref 8.6–10.3)
Chloride: 104 mmol/L (ref 98–110)
Creat: 0.98 mg/dL (ref 0.60–1.29)
Globulin: 2.6 g/dL (ref 1.9–3.7)
Glucose, Bld: 86 mg/dL (ref 65–99)
Potassium: 3.9 mmol/L (ref 3.5–5.3)
Sodium: 140 mmol/L (ref 135–146)
Total Bilirubin: 1.3 mg/dL — ABNORMAL HIGH (ref 0.2–1.2)
Total Protein: 7.2 g/dL (ref 6.1–8.1)
eGFR: 97 mL/min/1.73m2

## 2021-04-30 LAB — TSH: TSH: 1.3 mIU/L (ref 0.40–4.50)

## 2021-04-30 LAB — LIPID PANEL W/REFLEX DIRECT LDL
Cholesterol: 189 mg/dL (ref <200)
HDL: 51 mg/dL (ref >=40)
LDL Cholesterol (Calc): 106 mg/dL (calc) — ABNORMAL HIGH
Non-HDL Cholesterol (Calc): 138 mg/dL (calc) — ABNORMAL HIGH (ref <130)
Total CHOL/HDL Ratio: 3.7 (calc) (ref <5.0)
Triglycerides: 206 mg/dL — ABNORMAL HIGH (ref <150)

## 2021-04-30 NOTE — Progress Notes (Signed)
Frederico,   Your thyroid looks great.  Kidney, liver, glucose look wonderful.  No anemia.  Normal WBC.  TG are elevated but not as bad as 11 months ago.  LDL, bad cholesterol, not to primary prevention goal of 100.  HDL, good cholesterol, is to goal.  10 year CV risk is 1.9 percent which is good. Keep working on diet and exercise.  Try fish oil 4000mg  to get TG down.  Recheck in 1 year.

## 2021-05-22 ENCOUNTER — Other Ambulatory Visit: Payer: Self-pay

## 2021-05-22 ENCOUNTER — Emergency Department
Admission: RE | Admit: 2021-05-22 | Discharge: 2021-05-22 | Disposition: A | Discharge status: Home / Self Care | Payer: BC Managed Care – PPO | Source: Ambulatory Visit

## 2021-05-22 VITALS — BP 123/79 | HR 82 | Temp 98.2°F | Resp 20 | Ht 69.0 in | Wt 203.0 lb

## 2021-05-22 DIAGNOSIS — J309 Allergic rhinitis, unspecified: Secondary | ICD-10-CM

## 2021-05-22 DIAGNOSIS — J0100 Acute maxillary sinusitis, unspecified: Secondary | ICD-10-CM | POA: Diagnosis not present

## 2021-05-22 MED ORDER — CEFDINIR 300 MG PO CAPS: 300.0000 mg | ORAL_CAPSULE | Freq: Two times a day (BID) | ORAL | 0 refills | Status: AC

## 2021-05-22 MED ORDER — FEXOFENADINE HCL 180 MG PO TABS: 180.0000 mg | ORAL_TABLET | Freq: Every day | ORAL | 0 refills | Status: DC

## 2021-05-22 NOTE — Discharge Instructions (Addendum)
Advised patient to take medication as directed with food to completion.  Advised/encouraged patient to take Allegra daily with first dose of antibiotic for the next 5 of 7 days.  Then may use Allegra as needed for concurrent postnasal drainage/drip.  Encouraged patient increase daily water intake while taking these medications.

## 2021-05-22 NOTE — ED Provider Notes (Signed)
[facility]    CSN: [id number] Arrival date & time: 05/22/21  1353      History   Chief Complaint Chief Complaint  Patient presents with   Cough   Nasal Congestion   Ear Fullness    HPI Donald Gillespie is a 45 y.o. male.   HPI 45 year old male presents with cough, sinus nasal congestion, scratchy throat and ear fullness for 3 to 4 days.  PMH significant for asthma and seasonal allergies.  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 Authorizing Provider  cefdinir (OMNICEF) 300 MG capsule Take 1 capsule (300 mg total) by mouth 2 (two) times daily for 7 days. 05/22/21 05/29/21 Yes Trevor Iha, FNP  DM-Doxylamine-Acetaminophen (NYQUIL COLD & FLU PO) Take by mouth.   Yes [provider]  fexofenadine (ALLEGRA ALLERGY) 180 MG tablet Take 1 tablet (180 mg total) by mouth daily for 15 days. 05/22/21 06/06/21 Yes Trevor Iha, FNP  albuterol (VENTOLIN HFA) 108 (90 Base) MCG/ACT inhaler Inhale 2 puffs into the lungs every 6 (six) hours as needed for wheezing. 12/31/20 01/10/29  Breeback, Lonna Cobb, PA-C  clonazePAM (KLONOPIN) 0.5 MG tablet Take  0.5 mg by mouth 2 (two) times daily as needed.      [provider]  fluticasone (FLONASE) 50 MCG/ACT nasal spray Place 2 sprays into both nostrils daily.    [provider]  meloxicam (MOBIC) 15 MG tablet Take 1 tablet (15 mg total) by mouth daily. 02/26/21   Jomarie Longs, PA-C    Family History Family History  Problem Relation Age of Onset   Hypertension Mother    Breast cancer Other    Cancer Other    Diabetes Neg Hx    Heart attack Neg Hx    Hyperlipidemia Neg Hx     Social History Social History   Tobacco Use   Smoking status: Never   Smokeless tobacco: Never  Vaping Use   Vaping Use: Never used  Substance Use Topics   Alcohol use: Yes    Comment: 3 drinks a week   Drug use: No     Allergies   Clindamycin/lincomycin, Amoxicillin-pot clavulanate, Montelukast sodium, and Promethazine hcl   Review of Systems Review of Systems  HENT:  Positive for congestion.        Ear fullness x3 to 4 days  Respiratory:  Positive for cough.   All other systems reviewed and are negative.   Physical Exam Triage Vital Signs ED Triage Vitals  Enc Vitals Group     BP      Pulse  Resp      Temp      Temp src      SpO2      Weight      Height      Head Circumference      Peak Flow      Pain Score      Pain Loc      Pain Edu?      Excl. in GC?    No data found.  Updated Vital Signs BP 123/79 (BP Location: Right Arm)   Pulse 82   Temp 98.2 F (36.8 C) (Oral)   Resp 20   Ht 5\' 9"  (1.753 m)   Wt 203 lb (92.1 kg)   SpO2 97%   BMI 29.98 kg/m   Physical Exam Vitals and nursing note reviewed.  Constitutional:      General: He is not in acute distress.    Appearance: He is obese. He is not ill-appearing.  HENT:     Head: Normocephalic and atraumatic.     Right Ear: Tympanic membrane, ear canal and external ear normal.     Left Ear: Tympanic membrane, ear canal and external ear normal.     Mouth/Throat:     Mouth: Mucous membranes are  moist.     Pharynx: Oropharynx is clear.  Eyes:     Extraocular Movements: Extraocular movements intact.     Conjunctiva/sclera: Conjunctivae normal.     Pupils: Pupils are equal, round, and reactive to light.  Cardiovascular:     Rate and Rhythm: Normal rate and regular rhythm.     Pulses: Normal pulses.     Heart sounds: Normal heart sounds.  Pulmonary:     Effort: Pulmonary effort is normal.     Breath sounds: Normal breath sounds.  Musculoskeletal:        General: Normal range of motion.     Cervical back: Normal range of motion and neck supple.  Skin:    General: Skin is warm and dry.  Neurological:     General: No focal deficit present.     Mental Status: He is alert and oriented to person, place, and time.     UC Treatments / Results  Labs (all labs ordered are listed, but only abnormal results are displayed) Labs Reviewed - No data to display  EKG   Radiology No results found.  Procedures Procedures (including critical care time)  Medications Ordered in UC Medications - No data to display  Initial Impression / Assessment and Plan / UC Course  I have reviewed the triage vital signs and the nursing notes.  Pertinent labs & imaging results that were available during my care of the patient were reviewed by me and considered in my medical decision making (see chart for details).     MDM: 1.  Acute maxillary sinusitis-Rx'd Cefdinir; 2.  Allergic rhinitis-Rx'd Allegra. Advised patient to take medication as directed with food to completion.  Advised/encouraged patient to take Allegra daily with first dose of antibiotic for the next 5 of 7 days.  Then may use Allegra as needed for concurrent postnasal drainage/drip.  Encouraged patient increase daily water intake while taking these medications.  Patient discharged home, hemodynamically stable. Final Clinical Impressions(s) / UC Diagnoses   Final diagnoses:  Acute maxillary sinusitis, recurrence not specified   Allergic rhinitis, unspecified seasonality, unspecified trigger     Discharge Instructions      Advised patient to take medication as directed with food to completion.  Advised/encouraged  patient to take Allegra daily with first dose of antibiotic for the next 5 of 7 days.  Then may use Allegra as needed for concurrent postnasal drainage/drip.  Encouraged patient increase daily water intake while taking these medications.     ED Prescriptions     Medication Sig Dispense Auth. Provider   cefdinir (OMNICEF) 300 MG capsule Take 1 capsule (300 mg total) by mouth 2 (two) times daily for 7 days. 14 capsule Trevor Iha, FNP   fexofenadine [facility] ALLERGY) 180 MG tablet Take 1 tablet (180 mg total) by mouth daily for 15 days. 15 tablet Trevor Iha, FNP      PDMP not reviewed this encounter.   Trevor Iha, FNP 05/22/21 1501

## 2021-05-22 NOTE — ED Triage Notes (Signed)
Pt presents to Urgent Care with c/o cough, nasal congestion, scratchy throat, and ear fullness x 3-4 days. Has not done COVID test.

## 2021-06-10 ENCOUNTER — Encounter: Payer: Self-pay | Admitting: Physician Assistant

## 2021-06-10 ENCOUNTER — Ambulatory Visit: Payer: BC Managed Care – PPO

## 2021-06-10 ENCOUNTER — Telehealth (INDEPENDENT_AMBULATORY_CARE_PROVIDER_SITE_OTHER): Payer: BC Managed Care – PPO | Admitting: Physician Assistant

## 2021-06-10 VITALS — Temp 98.3°F

## 2021-06-10 DIAGNOSIS — J029 Acute pharyngitis, unspecified: Secondary | ICD-10-CM

## 2021-06-10 DIAGNOSIS — J069 Acute upper respiratory infection, unspecified: Secondary | ICD-10-CM

## 2021-06-10 MED ORDER — PREDNISONE 50 MG PO TABS: ORAL_TABLET | ORAL | 0 refills | Status: DC

## 2021-06-10 NOTE — Progress Notes (Signed)
..  Virtual Visit via Video Note  I connected with Donald Gillespie on 06/11/21 at  1:00 PM EST by a video enabled telemedicine application and verified that I am speaking with the correct person using two identifiers.  Location: Patient: home Provider: clinic  .Marland KitchenParticipating in visit:  Patient: Barbara Cower Provider: Tandy Gaw PA-C Provider in training: Asher Muir PA-C   I discussed the limitations of evaluation and management by telemedicine and the availability of in person appointments. The patient expressed understanding and agreed to proceed.  History of Present Illness: Pt is a 45 yo male who presents to the clinic with ST, congestion, body aches, fever, chills. Cough is not bad but sore throat is terrible. Daughter came home and was sick and got her mother and him sick. Symptoms started Sunday. Taking OTC medications. Wife was tested for flu/covid/strep and negative. He has been covid vaccinated with booster.    .. 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   Hypertriglyceridemia 05/13/2020   Primary insomnia 05/13/2020   Onychomycosis 02/26/2021   Great toe pain, right 02/26/2021   Class 1 obesity due to excess calories without serious comorbidity with body mass index (BMI) of 31.0 to 31.9 in adult 04/29/2021   Elevated LDL cholesterol level 04/30/2021   Resolved Ambulatory Problems    Diagnosis Date Noted   No Resolved Ambulatory Problems   Past Medical History:  Diagnosis Date   Asthma    Depression    Seasonal allergies     Observations/Objective: No acute distress No cough or labored breathing Normal mood and appearance  .Marland Kitchen Today's Vitals   06/10/21 1121  Temp: 98.3 F (36.8 C)   There is no height or weight on file to  calculate BMI.    Assessment and Plan: .Marland KitchenWeslee was seen today for sore throat.  Diagnoses and all orders for this visit:  Sore throat -     predniSONE (DELTASONE) 50 MG tablet; Take one tablet daily for 5 days.  Viral upper respiratory tract infection   Negative covid home test. Vaccinated x3. Wife and daughter with similar symptoms negative for flu/covid/strep.  Discussed likely another virus on Day 3.  ST worse symptom encouraged gargling with salt water, tylenol and prednisone.  Follow up as needed and if symptoms worse or persist.   Follow Up Instructions:    I discussed the assessment and treatment plan with the patient. The patient was provided an opportunity to ask questions and all were answered. The patient agreed with the plan and demonstrated an understanding of the instructions.   The patient was advised to call back or seek an in-person evaluation if the symptoms worsen or if the condition fails to improve as anticipated.   Tandy Gaw, PA-C

## 2021-06-15 ENCOUNTER — Encounter: Payer: Self-pay | Admitting: Physician Assistant

## 2021-06-16 MED ORDER — DOXYCYCLINE HYCLATE 100 MG PO TABS: 100.0000 mg | ORAL_TABLET | Freq: Two times a day (BID) | ORAL | 0 refills | Status: DC

## 2021-07-08 ENCOUNTER — Encounter: Payer: Self-pay | Admitting: Physician Assistant

## 2021-07-09 NOTE — Telephone Encounter (Signed)
Patient has been scheduled this afternoon with Jade.

## 2021-07-23 ENCOUNTER — Encounter: Payer: Self-pay | Admitting: Physician Assistant

## 2021-07-25 ENCOUNTER — Encounter: Payer: Self-pay | Admitting: Physician Assistant

## 2021-07-25 ENCOUNTER — Ambulatory Visit: Payer: BC Managed Care – PPO | Admitting: Physician Assistant

## 2021-07-25 ENCOUNTER — Other Ambulatory Visit: Payer: Self-pay

## 2021-07-25 VITALS — BP 134/74 | HR 96 | Temp 98.1°F | Ht 69.0 in | Wt 211.0 lb

## 2021-07-25 DIAGNOSIS — J40 Bronchitis, not specified as acute or chronic: Secondary | ICD-10-CM

## 2021-07-25 DIAGNOSIS — J329 Chronic sinusitis, unspecified: Secondary | ICD-10-CM | POA: Diagnosis not present

## 2021-07-25 MED ORDER — METHYLPREDNISOLONE 4 MG PO TBPK: ORAL_TABLET | ORAL | 0 refills | Status: DC

## 2021-07-25 MED ORDER — AZITHROMYCIN 250 MG PO TABS: ORAL_TABLET | ORAL | 0 refills | Status: DC

## 2021-07-25 NOTE — Progress Notes (Signed)
Subjective:    Patient ID: Donald Gillespie, male    DOB: 1976-04-01, 45 y.o.   MRN: [id number]  HPI Pt is a 45 yo male who presents to the clinic with sinus pressure, congestion, drainage, cough, fatigue. He got better from last infection for about a week when he started having more URI symptoms. They have progressed until now he has had some sharp pains in chest when he takes a deep breath in. Symptoms now ongoing for 2 weeks. Continues to have a dry cough. Tested negative for covid. Using OtC cold medications and albuterol. No significant SOB, body aches, fever.  .. 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     Objective:   Physical Exam Vitals reviewed.  Constitutional:      Appearance: Normal appearance.  HENT:     Head: Normocephalic.     Right Ear: Tympanic membrane normal.     Left Ear: Tympanic membrane normal.     Nose: Congestion present.     Mouth/Throat:     Mouth: Mucous membranes are moist.     Pharynx: Posterior oropharyngeal erythema present. No oropharyngeal exudate.  Eyes:     Extraocular Movements: Extraocular movements intact.     Conjunctiva/sclera: Conjunctivae normal.     Pupils: Pupils are equal, round, and  reactive to light.  Neck:     Vascular: No carotid bruit.  Cardiovascular:     Rate and Rhythm: Normal rate and regular rhythm.     Pulses: Normal pulses.  Pulmonary:     Effort: Pulmonary effort is normal.     Breath sounds: Normal breath sounds.  Musculoskeletal:     Right lower leg: No edema.     Left lower leg: No edema.  Lymphadenopathy:     Cervical: No cervical adenopathy.  Neurological:     General: No focal deficit present.     Mental Status: He is alert and oriented to person, place, and time.  Psychiatric:        Mood and Affect: Mood normal.          Assessment & Plan:  .Marland KitchenMichiel was seen today for cough.  Diagnoses and all orders for this visit:  Sinobronchitis -     methylPREDNISolone (MEDROL DOSEPAK) 4 MG TBPK tablet; Take as directed by package insert. -     azithromycin (ZITHROMAX Z-PAK) 250 MG tablet; Take 2 tablets (500 mg) on  Day 1,  followed by 1 tablet (250 mg) once daily on Days 2 through 5.   Treated for sinobronchitis with zpak and medrol dose pack.  Continue flonase and albuterol as needed.  Discussed likely some costochondritis from his coughing.  Follow up if not  improving.

## 2021-07-25 NOTE — Patient Instructions (Signed)
Acute Bronchitis, Adult °Acute bronchitis is sudden inflammation of the main airways (bronchi) that come off the windpipe (trachea) in the lungs. The swelling causes the airways to get smaller and make more mucus than normal. This can make it hard to breathe and can cause coughing or noisy breathing (wheezing). °Acute bronchitis may last several weeks. The cough may last longer. Allergies, asthma, and exposure to smoke may make the condition worse. °What are the causes? °This condition can be caused by germs and by substances that irritate the lungs, including: °Cold and flu viruses. The most common cause of this condition is the virus that causes the common cold. °Bacteria. This is less common. °Breathing in substances that irritate the lungs, including: °Smoke from cigarettes and other forms of tobacco. °Dust and pollen. °Fumes from household cleaning products, gases, or burned fuel. °Indoor or outdoor air pollution. °What increases the risk? °The following factors may make you more likely to develop this condition: °A weak body's defense system, also called the immune system. °A condition that affects your lungs and breathing, such as asthma. °What are the signs or symptoms? °Common symptoms of this condition include: °Coughing. This may bring up clear, yellow, or green mucus from your lungs (sputum). °Wheezing. °Runny or stuffy nose. °Having too much mucus in your lungs (chest congestion). °Shortness of breath. °Aches and pains, including sore throat or chest. °How is this diagnosed? °This condition is usually diagnosed based on: °Your symptoms and medical history. °A physical exam. °You may also have other tests, including tests to rule out other conditions, such as pneumonia. These tests include: °A test of lung function. °Test of a mucus sample to look for the presence of bacteria. °Tests to check the oxygen level in your blood. °Blood tests. °Chest X-ray. °How is this treated? °Most cases of acute bronchitis  clear up over time without treatment. Your health care provider may recommend: °Drinking more fluids to help thin your mucus so it is easier to cough up. °Taking inhaled medicine (inhaler) to improve air flow in and out of your lungs. °Using a vaporizer or a humidifier. These are machines that add water to the air to help you breathe better. °Taking a medicine that thins mucus and clears congestion (expectorant). °Taking a medicine that prevents or stops coughing (cough suppressant). °It is notcommon to take an antibiotic medicine for this condition. °Follow these instructions at home: ° °Take over-the-counter and prescription medicines only as told by your health care provider. °Use an inhaler, vaporizer, or humidifier as told by your health care provider. °Take two teaspoons (10 mL) of honey at bedtime to lessen coughing at night. °Drink enough fluid to keep your urine pale yellow. °Do not use any products that contain nicotine or tobacco. These products include cigarettes, chewing tobacco, and vaping devices, such as e-cigarettes. If you need help quitting, ask your health care provider. °Get plenty of rest. °Return to your normal activities as told by your health care provider. Ask your health care provider what activities are safe for you. °Keep all follow-up visits. This is important. °How is this prevented? °To lower your risk of getting this condition again: °Wash your hands often with soap and water for at least 20 seconds. If soap and water are not available, use hand sanitizer. °Avoid contact with people who have cold symptoms. °Try not to touch your mouth, nose, or eyes with your hands. °Avoid breathing in smoke or chemical fumes. Breathing smoke or chemical fumes will make your condition   worse. °Get the flu shot every year. °Contact a health care provider if: °Your symptoms do not improve after 2 weeks. °You have trouble coughing up the mucus. °Your cough keeps you awake at night. °You have a  fever. °Get help right away if you: °Cough up blood. °Feel pain in your chest. °Have severe shortness of breath. °Faint or keep feeling like you are going to faint. °Have a severe headache. °Have a fever or chills that get worse. °These symptoms may represent a serious problem that is an emergency. Do not wait to see if the symptoms will go away. Get medical help right away. Call your local emergency services (911 in the [place] Do not drive yourself to the hospital. °Summary °Acute bronchitis is inflammation of the main airways (bronchi) that come off the windpipe (trachea) in the lungs. The swelling causes the airways to get smaller and make more mucus than normal. °Drinking more fluids can help thin your mucus so it is easier to cough up. °Take over-the-counter and prescription medicines only as told by your health care provider. °Do not use any products that contain nicotine or tobacco. These products include cigarettes, chewing tobacco, and vaping devices, such as e-cigarettes. If you need help quitting, ask your health care provider. °Contact a health care provider if your symptoms do not improve after 2 weeks. °This information is not intended to replace advice given to you by your health care provider. Make sure you discuss any questions you have with your health care provider. °Document Revised: 11/13/2020 Document Reviewed: 11/13/2020 °Elsevier Patient Education © 2022 [employer] ° °

## 2021-07-29 ENCOUNTER — Encounter: Payer: Self-pay | Admitting: Physician Assistant

## 2021-08-28 ENCOUNTER — Encounter: Payer: Self-pay | Admitting: Physician Assistant

## 2021-08-28 NOTE — Telephone Encounter (Signed)
Patient transferred care in August 2021. He states Clonazepam from previous PCP. Never written by you. Please advise.

## 2021-08-29 MED ORDER — CLONAZEPAM 0.5 MG PO TABS: 0.5000 mg | ORAL_TABLET | Freq: Two times a day (BID) | ORAL | 2 refills | Status: DC | PRN

## 2021-12-10 IMAGING — DX DG TOE GREAT 2+V*R*
3 series · 3 of 3 positions shown · non-contrast
Comparison: None.

CLINICAL DATA: Great toe pain for 2 months becoming more frequent.
No known injury.

EXAM:
RIGHT GREAT TOE

[toe ap]
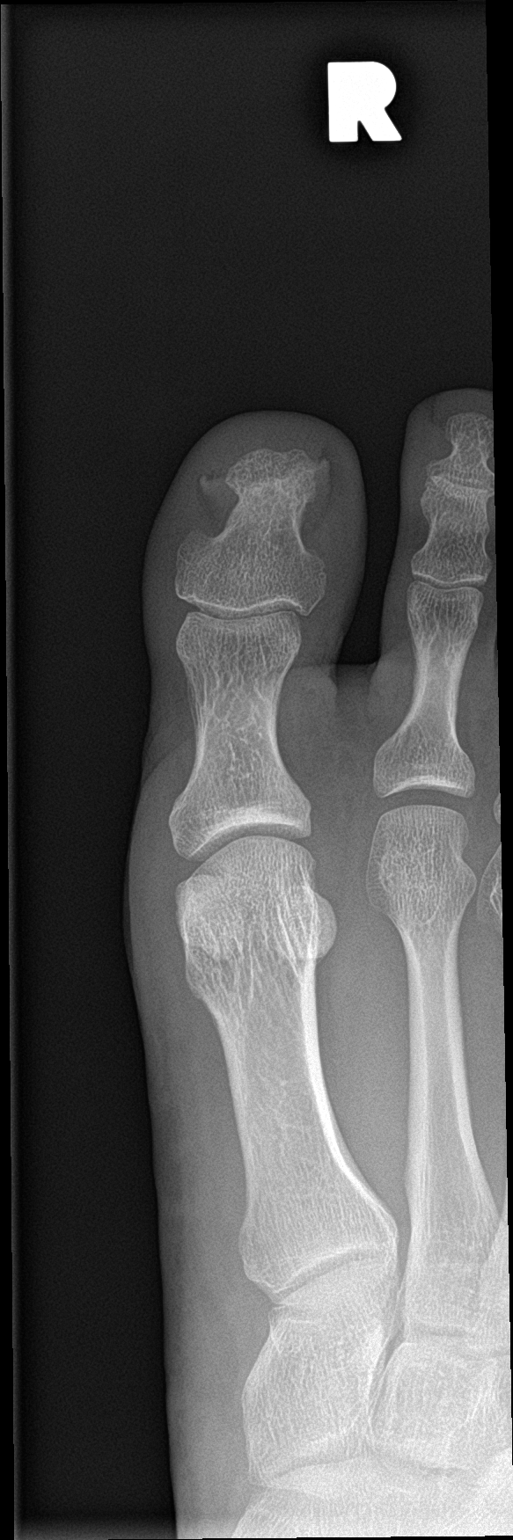

[toe obl]
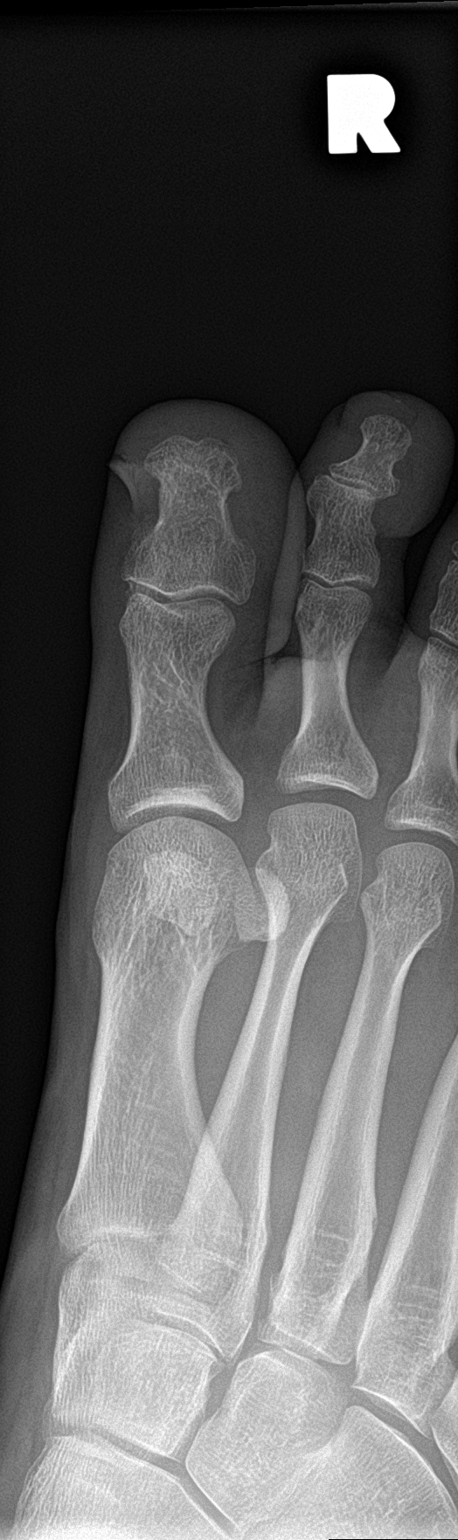

[toe lat]
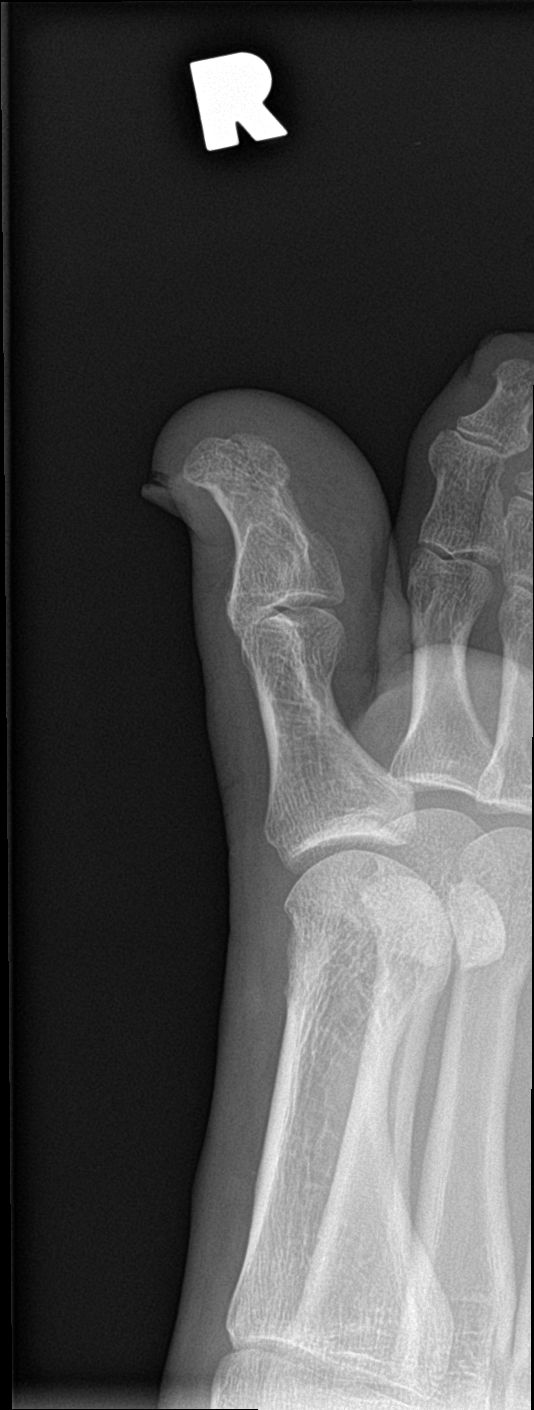

[3 of 3 positions shown; findings below may reference images not displayed]

FINDINGS: There is no evidence of fracture or dislocation. Normal alignment.
Normal joint spaces. No erosion. There is no evidence of arthropathy
or other focal bone abnormality. Soft tissues are unremarkable.
IMPRESSION: Negative radiographs of the right great toe.

## 2022-02-08 ENCOUNTER — Encounter: Payer: Self-pay | Admitting: Physician Assistant

## 2022-02-13 ENCOUNTER — Encounter: Payer: Self-pay | Admitting: Physician Assistant

## 2022-02-13 ENCOUNTER — Ambulatory Visit: Payer: BC Managed Care – PPO | Admitting: Physician Assistant

## 2022-02-13 VITALS — BP 130/80 | HR 60 | Ht 69.0 in | Wt 197.0 lb

## 2022-02-13 DIAGNOSIS — J45990 Exercise induced bronchospasm: Secondary | ICD-10-CM | POA: Diagnosis not present

## 2022-02-13 MED ORDER — MONTELUKAST SODIUM 10 MG PO TABS: 10.0000 mg | ORAL_TABLET | Freq: Every day | ORAL | 1 refills | Status: DC

## 2022-02-13 NOTE — Patient Instructions (Signed)
Exercise-Induced Bronchoconstriction, Adult  Exercise-induced bronchoconstriction (EIB) happens when the lower airways narrow during or after vigorous activity or exercise. The lower airways are the air passages (bronchi) inside the lungs. When the airways narrow, this can cause coughing, high-pitched whistling sounds when you breathe, most often when you breathe out (wheezing), and trouble breathing (shortness of breath). This makes it hard to breathe. Anyone can develop EIB, even people who do not have allergies or asthma. With proper treatment, most people with EIB can be active and exercise normally. What are the causes? The exact cause of this condition is not known. Symptoms are brought on (triggered) by physical activity. EIB can also be triggered by: Breathing very cold and dry air more than hot and humid air. Chemicals, such as chlorine in swimming pools, pesticides, or fertilizers. Outdoor triggers, such as: Air pollution. Car exhaust. Pollen from grass, trees, or flowers. Campfire smoke. Indoor triggers, such as: Dust. Mold. Tobacco smoke. Cleaning solutions. Animal dander. What increases the risk? You are more likely to develop this condition if you: Have a family history of asthma or allergies (atopy). Participate in sports that require constant motion, such as basketball, hockey, skiing, and running. Work outdoors. Exercise where there are higher levels of one or more EIB triggers. What are the signs or symptoms? Symptoms of this condition include: Coughing. Wheezing. Shortness of breath. Chest pain or tightness. Sore throat. Upset stomach. Symptoms may worsen after exercise has stopped. How is this diagnosed? EIB is diagnosed with your medical history and a physical exam. You may also have other tests, including: Lung function studies (spirometry). An exercise test to check for EIB symptoms. Allergy tests. How is this treated? Treatment for EIB includes  preventing symptoms when possible, and treating EIB quickly when symptoms occur. Treatment may include: Taking medicine that your health care provider prescribes. Medicine comes in different forms, including: Medicines that you breathe in (inhale). These include: Steroids. These help to control your symptoms and are usually taken every day. Quick relief medicines. These help to quickly relieve your breathing difficulty. Medicines that you take by mouth (orally). These help to control allergies and asthma. Avoiding triggers. Stopping physical activity or exercise to rest. Follow these instructions at home: Take over-the-counter and prescription medicines only as told by your health care provider. Do not use any products that contain nicotine or tobacco. These products include cigarettes, chewing tobacco, and vaping devices, such as e-cigarettes. If you need help quitting, ask your health care provider. Make changes in your workout as told by your health care provider. Exercise is important to your health and well-being. Keep quick relief medicine with you when you are exercising. Tell your exercise partners about your condition. Wear a medical ID bracelet. If you are planning to exercise alone or in an isolated area, let someone know where you are going and when you will be back. You may need to see a health care provider who specializes in allergies (allergist) or the lungs (pulmonologist) for more tests. Keep all follow-up visits. This is important. How is this prevented? Take medicines to prevent EIB as told by your health care provider. Warm up before starting to play sports or exercise. Exercise indoors to avoid outdoor triggers. Cover your nose and mouth with a scarf to warm air that is very cold. Tell your workout partners or trainer about your condition. Tell them how to help you if you have an episode. Contact a health care provider if: You have coughing, wheezing, or shortness  of  breath that continues after treatment. Your coughing wakes you up at night. You have less endurance than you used to. Get help right away if: Your medicine is not helping you breathe better. You cannot catch your breath. You pass out. These symptoms may be an emergency. Get help right away. Call 911. Do not wait to see if the symptoms will go away. Do not drive yourself to the hospital. Summary Exercise-induced bronchoconstriction (EIB) happens when the airways narrow during or after exercise. When the airways narrow, this can cause coughing, wheezing, and shortness of breath. It can be difficult to breathe. Take over-the-counter and prescription medicines only as told by your health care provider. Make changes in your workout as told by your health care provider. Contact a health care provider if you continue to have trouble breathing after treatment. This information is not intended to replace advice given to you by your health care provider. Make sure you discuss any questions you have with your health care provider. Document Revised: 05/27/2021 Document Reviewed: 05/04/2021 Elsevier Patient Education  2023 [employer].

## 2022-02-13 NOTE — Progress Notes (Unsigned)
Established Patient Office Visit  Subjective   Patient ID: Donald Gillespie, male    DOB: 11/02/1975  Age: 46 y.o. MRN: [id number]  Chief Complaint  Patient presents with   Asthma    Worse when running past two weeks    HPI Pt is a 46 yo male with history of very well controlled asthma but has had recent issue with asthma exacerbation while running. He runs during other seasons but this is the first time he is running in the summer. He notices about 8 minutes into a run he starts to get real short of breath and chest tightness. Resolves with rest. Using albuterol before exercise and helping some. Taking zyrtec, flonase, albuterol. He has no problems breathing any other time. Has singulair listed as allergy but does not remember this medication or that it was a problem. He is willing to try again.     .. 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    Objective:     BP 130/80   Pulse 60   Ht 5\' 9"  (1.753 m)   Wt 197 lb (89.4 kg)   SpO2 100%   PF 750 L/min Comment: green  BMI 29.09 kg/m  BP Readings from Last 3 Encounters:  02/13/22 130/80  07/25/21 134/74  05/22/21 123/79   Wt Readings from Last 3 Encounters:   02/13/22 197 lb (89.4 kg)  07/25/21 211 lb (95.7 kg)  05/22/21 203 lb (92.1 kg)      Physical Exam Vitals reviewed.  Constitutional:      Appearance: Normal appearance.  HENT:     Head: Normocephalic.     Mouth/Throat:     Mouth: Mucous membranes are moist.  Eyes:     Conjunctiva/sclera: Conjunctivae normal.  Cardiovascular:     Rate and Rhythm: Normal rate and regular rhythm.     Pulses: Normal pulses.     Heart sounds: Normal heart sounds.  Pulmonary:     Effort: Pulmonary effort is normal.     Breath sounds: Normal breath sounds. No wheezing or rhonchi.  Musculoskeletal:     Cervical back: Normal range of motion and neck supple.     Right lower leg: No edema.     Left lower leg: No edema.  Neurological:     General: No focal deficit present.     Mental Status: He is alert and oriented to person, place, and time.  Psychiatric:        Mood and Affect: Mood normal.    Peak Flow over 750      Assessment & Plan:  .10/29/22Wilkins was seen today for asthma.  Diagnoses and all orders for this visit:  Exercise-induced asthma -  montelukast (SINGULAIR) 10 MG tablet; Take 1 tablet (10 mg total) by mouth at bedtime.   Discussed with patient peak flow looks amazing!  Lungs sound great.  Continue zyrtec and flonase daily.  Added singulair. Not sure what reaction patient had in the past because explanation was not listed Pt would like to try again Use albuterol 2 puffs 15 minutes before cardio May need to use daily preventive inhaler if not improving.   Tandy Gaw, PA-C

## 2022-02-15 ENCOUNTER — Other Ambulatory Visit: Payer: Self-pay | Admitting: Physician Assistant

## 2022-04-18 ENCOUNTER — Encounter: Payer: Self-pay | Admitting: Physician Assistant

## 2022-04-30 ENCOUNTER — Ambulatory Visit (INDEPENDENT_AMBULATORY_CARE_PROVIDER_SITE_OTHER): Payer: BC Managed Care – PPO | Admitting: Physician Assistant

## 2022-04-30 VITALS — BP 111/77 | HR 58 | Ht 69.0 in | Wt 205.0 lb

## 2022-04-30 DIAGNOSIS — Z1329 Encounter for screening for other suspected endocrine disorder: Secondary | ICD-10-CM | POA: Diagnosis not present

## 2022-04-30 DIAGNOSIS — Z131 Encounter for screening for diabetes mellitus: Secondary | ICD-10-CM

## 2022-04-30 DIAGNOSIS — Z0000 Encounter for general adult medical examination without abnormal findings: Secondary | ICD-10-CM

## 2022-04-30 DIAGNOSIS — Z23 Encounter for immunization: Secondary | ICD-10-CM

## 2022-04-30 DIAGNOSIS — J209 Acute bronchitis, unspecified: Secondary | ICD-10-CM

## 2022-04-30 DIAGNOSIS — E781 Pure hyperglyceridemia: Secondary | ICD-10-CM | POA: Diagnosis not present

## 2022-04-30 DIAGNOSIS — Z1211 Encounter for screening for malignant neoplasm of colon: Secondary | ICD-10-CM

## 2022-04-30 MED ORDER — PREDNISONE 50 MG PO TABS: ORAL_TABLET | ORAL | 0 refills | Status: DC

## 2022-04-30 MED ORDER — AZITHROMYCIN 250 MG PO TABS: ORAL_TABLET | ORAL | 0 refills | Status: DC

## 2022-04-30 NOTE — Progress Notes (Signed)
Complete physical exam  Patient: Donald Gillespie   DOB: June 25, 1976   46 y.o. Male  MRN: [id number]  Subjective:    Chief Complaint  Patient presents with   Annual Exam    Donald Gillespie is a 46 y.o. male who presents today for a complete physical exam. He reports consuming a general diet.  Pt is overall active and does many different things for exercise.   He generally feels well. He reports sleeping well. He does have additional problems to discuss today.   He had covid 2 weeks ago. He is better but having a lot of fatigue, cough, congestion. Worse in morning and better throughout the day. No fever, chills, body aches.    Most recent fall risk assessment:    04/30/2022    9:32 AM  Fall Risk   Falls in the past year? 0  Number falls in past yr: 0  Injury with Fall? 0  Risk for fall due to : No Fall Risks  Follow up Falls evaluation completed     Most recent depression screenings:    04/30/2022    9:54 AM 04/29/2021    8:25 AM  PHQ 2/9 Scores  PHQ - 2 Score 0 0  PHQ- 9 Score 3     Vision:Within last year and Dental: No current dental problems and Receives regular dental care  Patient Active Problem List   Diagnosis Date Noted   Exercise-induced asthma 02/13/2022   Elevated LDL cholesterol level 04/30/2021   Class 1 obesity due to excess calories without serious comorbidity with body mass index (BMI) of 31.0 to 31.9 in adult 04/29/2021   Onychomycosis 02/26/2021   Great toe pain, right 02/26/2021   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   Hypertriglyceridemia 05/13/2020   Primary insomnia 05/13/2020   Mild intermittent asthma without complication 03/12/2020   Allergies 03/12/2020   Right shoulder pain 08/28/2011   Anxiety 09/01/2010   ASTHMA 09/01/2010   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   Past Medical History:  Diagnosis Date   Anxiety    Asthma    Depression    Seasonal allergies    Family History  Problem Relation Age of Onset   Hypertension Mother    Breast cancer  Other    Cancer Other    Diabetes Neg Hx    Heart attack Neg Hx    Hyperlipidemia Neg Hx    Allergies  Allergen Reactions   Amoxicillin-Pot Clavulanate Diarrhea   Clindamycin/Lincomycin Nausea And Vomiting   Promethazine Hcl       Patient Care Team: Nolene Ebbs as PCP - General (Family Medicine)   Outpatient Medications Prior to Visit  Medication Sig   albuterol (VENTOLIN HFA) 108 (90 Base) MCG/ACT inhaler INHALE 2 PUFFS INTO THE LUNGS EVERY 6 HOURS AS NEEDED FOR WHEEZE   cetirizine (ZYRTEC) 10 MG tablet Take 10 mg by mouth daily.   clonazePAM (KLONOPIN) 0.5 MG tablet Take 1 tablet (0.5 mg total) by mouth 2 (two) times daily as needed.   fluticasone (FLONASE) 50 MCG/ACT nasal spray Place 2 sprays into both nostrils daily.   montelukast (SINGULAIR) 10 MG tablet Take 1 tablet (10 mg total) by mouth at bedtime.   [DISCONTINUED] terbinafine (LAMISIL) 250 MG tablet Take 250 mg by mouth daily.   No facility-administered medications prior to visit.    ROS See HPI.        Objective:     BP 111/77   Pulse (!) 58   Ht 5\' 9"  (1.753 m)  Wt 205 lb (93 kg)   SpO2 100%   BMI 30.27 kg/m  BP Readings from Last 3 Encounters:  04/30/22 111/77  02/13/22 130/80  07/25/21 134/74   Wt Readings from Last 3 Encounters:  04/30/22 205 lb (93 kg)  02/13/22 197 lb (89.4 kg)  07/25/21 211 lb (95.7 kg)      Physical Exam  BP 111/77   Pulse (!) 58   Ht 5\' 9"  (1.753 m)   Wt 205 lb (93 kg)   SpO2 100%   BMI 30.27 kg/m   General Appearance:    Alert, cooperative, no distress, appears stated age  Head:    Normocephalic, without obvious abnormality, atraumatic  Eyes:    PERRL, conjunctiva/corneas clear, EOM's intact, fundi    benign, both eyes       Ears:    Normal TM's and external ear canals, both ears  Nose:   Nares normal, septum midline, mucosa normal, no drainage    or sinus tenderness  Throat:   Lips, mucosa, and tongue normal; teeth and gums normal  Neck:    Supple, symmetrical, trachea midline, no adenopathy;       thyroid:  No enlargement/tenderness/nodules; no carotid   bruit or JVD  Back:     Symmetric, no curvature, ROM normal, no CVA tenderness  Lungs:     Clear to auscultation bilaterally, respirations unlabored  Chest wall:    No tenderness or deformity  Heart:    Regular rate and rhythm, S1 and S2 normal, no murmur, rub   or gallop  Abdomen:     Soft, non-tender, bowel sounds active all four quadrants,    no masses, no organomegaly        Extremities:   Extremities normal, atraumatic, no cyanosis or edema  Pulses:   2+ and symmetric all extremities  Skin:   Skin color, texture, turgor normal, no rashes or lesions  Lymph nodes:   Cervical, supraclavicular, and axillary nodes normal  Neurologic:   CNII-XII intact. Normal strength, sensation and reflexes      throughout        Assessment & Plan:    Routine Health Maintenance and Physical Exam  Immunization History  Administered Date(s) Administered   Influenza Split 05/29/2014   Influenza,inj,Quad PF,6+ Mos 05/13/2018, 05/13/2020, 04/29/2021, 04/30/2022   Influenza,inj,quad, With Preservative 06/05/2015, 05/06/2016, 05/12/2017   Influenza,trivalent, recombinat, inj, PF 05/29/2014   Influenza-Unspecified 05/10/2013, 06/05/2015, 05/06/2016, 05/12/2017   Moderna Sars-Covid-2 Vaccination 09/28/2019, 10/26/2019, 04/29/2021   Pneumococcal Polysaccharide-23 05/12/2017   Tdap 06/05/2015    Health Maintenance  Topic Date Due   Fecal DNA (Cologuard)  Never done   Hepatitis C Screening  06/10/2022 (Originally 05/07/1993)   HIV Screening  06/10/2022 (Originally 05/07/1990)   COVID-19 Vaccine (4 - Moderna series) 05/01/2023 (Originally 06/24/2021)   TETANUS/TDAP  06/04/2025   INFLUENZA VACCINE  Completed   HPV VACCINES  Aged Out    Discussed health benefits of physical activity, and encouraged him to engage in regular exercise appropriate for his age and condition. Donald Gillespie was  seen today for annual exam.  Diagnoses and all orders for this visit:  Annual physical exam -     Lipid Panel w/reflex Direct LDL -     TSH -     COMPLETE METABOLIC PANEL WITH GFR -     CBC with Differential/Platelet -     Magnesium  Thyroid disorder screen -     TSH  Hypertriglyceridemia -     Lipid Panel  w/reflex Direct LDL  Screening for diabetes mellitus -     COMPLETE METABOLIC PANEL WITH GFR  Colon cancer screening -     Cancel: Cologuard -     Cologuard  Flu vaccine need -     Flu Vaccine QUAD 36mo+IM (Fluarix, Fluzone & Alfiuria Quad PF)  Acute bronchitis, unspecified organism -     azithromycin (ZITHROMAX Z-PAK) 250 MG tablet; Take 2 tablets (500 mg) on  Day 1,  followed by 1 tablet (250 mg) once daily on Days 2 through 5. -     predniSONE (DELTASONE) 50 MG tablet; One tab PO daily for 5 days.   Donald Kitchen.Start a regular exercise program and make sure you are eating a healthy diet Try to eat 4 servings of dairy a day or take a calcium supplement (500mg  twice a day). Flu shot given today.  Fasting labs ordered Cologuard ordered Declined covid vaccine  Post covid bronchitis- discussed symptomatic care for another week unless symptoms worsen. Given zpak and prednisone if not improving       , PA-C

## 2022-04-30 NOTE — Patient Instructions (Signed)
Health Maintenance, Male Adopting a healthy lifestyle and getting preventive care are important in promoting health and wellness. Ask your health care provider about: The right schedule for you to have regular tests and exams. Things you can do on your own to prevent diseases and keep yourself healthy. What should I know about diet, weight, and exercise? Eat a healthy diet  Eat a diet that includes plenty of vegetables, fruits, low-fat dairy products, and lean protein. Do not eat a lot of foods that are high in solid fats, added sugars, or sodium. Maintain a healthy weight Body mass index (BMI) is a measurement that can be used to identify possible weight problems. It estimates body fat based on height and weight. Your health care provider can help determine your BMI and help you achieve or maintain a healthy weight. Get regular exercise Get regular exercise. This is one of the most important things you can do for your health. Most adults should: Exercise for at least 150 minutes each week. The exercise should increase your heart rate and make you sweat (moderate-intensity exercise). Do strengthening exercises at least twice a week. This is in addition to the moderate-intensity exercise. Spend less time sitting. Even light physical activity can be beneficial. Watch cholesterol and blood lipids Have your blood tested for lipids and cholesterol at 47 years of age, then have this test every 5 years. You may need to have your cholesterol levels checked more often if: Your lipid or cholesterol levels are high. You are older than 47 years of age. You are at high risk for heart disease. What should I know about cancer screening? Many types of cancers can be detected early and may often be prevented. Depending on your health history and family history, you may need to have cancer screening at various ages. This may include screening for: Colorectal cancer. Prostate cancer. Skin cancer. Lung  cancer. What should I know about heart disease, diabetes, and high blood pressure? Blood pressure and heart disease High blood pressure causes heart disease and increases the risk of stroke. This is more likely to develop in people who have high blood pressure readings or are overweight. Talk with your health care provider about your target blood pressure readings. Have your blood pressure checked: Every 3-5 years if you are 18-39 years of age. Every year if you are 40 years old or older. If you are between the ages of 65 and 75 and are a current or former smoker, ask your health care provider if you should have a one-time screening for abdominal aortic aneurysm (AAA). Diabetes Have regular diabetes screenings. This checks your fasting blood sugar level. Have the screening done: Once every three years after age 45 if you are at a normal weight and have a low risk for diabetes. More often and at a younger age if you are overweight or have a high risk for diabetes. What should I know about preventing infection? Hepatitis B If you have a higher risk for hepatitis B, you should be screened for this virus. Talk with your health care provider to find out if you are at risk for hepatitis B infection. Hepatitis C Blood testing is recommended for: Everyone born from 1945 through 1965. Anyone with known risk factors for hepatitis C. Sexually transmitted infections (STIs) You should be screened each year for STIs, including gonorrhea and chlamydia, if: You are sexually active and are younger than 47 years of age. You are older than 47 years of age and your   health care provider tells you that you are at risk for this type of infection. Your sexual activity has changed since you were last screened, and you are at increased risk for chlamydia or gonorrhea. Ask your health care provider if you are at risk. Ask your health care provider about whether you are at high risk for HIV. Your health care provider  may recommend a prescription medicine to help prevent HIV infection. If you choose to take medicine to prevent HIV, you should first get tested for HIV. You should then be tested every 3 months for as long as you are taking the medicine. Follow these instructions at home: Alcohol use Do not drink alcohol if your health care provider tells you not to drink. If you drink alcohol: Limit how much you have to 0-2 drinks a day. Know how much alcohol is in your drink. In the [place] one drink equals one 12 oz bottle of beer (355 mL), one 5 oz glass of wine (148 mL), or one 1 oz glass of hard liquor (44 mL). Lifestyle Do not use any products that contain nicotine or tobacco. These products include cigarettes, chewing tobacco, and vaping devices, such as e-cigarettes. If you need help quitting, ask your health care provider. Do not use street drugs. Do not share needles. Ask your health care provider for help if you need support or information about quitting drugs. General instructions Schedule regular health, dental, and eye exams. Stay current with your vaccines. Tell your health care provider if: You often feel depressed. You have ever been abused or do not feel safe at home. Summary Adopting a healthy lifestyle and getting preventive care are important in promoting health and wellness. Follow your health care provider's instructions about healthy diet, exercising, and getting tested or screened for diseases. Follow your health care provider's instructions on monitoring your cholesterol and blood pressure. This information is not intended to replace advice given to you by your health care provider. Make sure you discuss any questions you have with your health care provider. Document Revised: 12/02/2020 Document Reviewed: 12/02/2020 Elsevier Patient Education  2023 Elsevier Inc.  

## 2022-05-01 LAB — CBC WITH DIFFERENTIAL/PLATELET
Absolute Monocytes: 589 cells/uL (ref 200–950)
Basophils Absolute: 37 cells/uL (ref 0–200)
Basophils Relative: 0.4 %
Eosinophils Absolute: 230 cells/uL (ref 15–500)
Eosinophils Relative: 2.5 %
HCT: 47.4 % (ref 38.5–50.0)
Hemoglobin: 16.2 g/dL (ref 13.2–17.1)
Lymphs Abs: 1472 cells/uL (ref 850–3900)
MCH: 31.2 pg (ref 27.0–33.0)
MCHC: 34.2 g/dL (ref 32.0–36.0)
MCV: 91.3 fL (ref 80.0–100.0)
MPV: 9.4 fL (ref 7.5–12.5)
Monocytes Relative: 6.4 %
Neutro Abs: 6872 cells/uL (ref 1500–7800)
Neutrophils Relative %: 74.7 %
Platelets: 219 10*3/uL (ref 140–400)
RBC: 5.19 10*6/uL (ref 4.20–5.80)
RDW: 12.8 % (ref 11.0–15.0)
Total Lymphocyte: 16 %
WBC: 9.2 10*3/uL (ref 3.8–10.8)

## 2022-05-01 LAB — COMPLETE METABOLIC PANEL WITH GFR
AG Ratio: 1.7 (calc) (ref 1.0–2.5)
ALT: 44 U/L (ref 9–46)
AST: 24 U/L (ref 10–40)
Albumin: 4.7 g/dL (ref 3.6–5.1)
Alkaline phosphatase (APISO): 70 U/L (ref 36–130)
BUN: 14 mg/dL (ref 7–25)
CO2: 27 mmol/L (ref 20–32)
Calcium: 9.7 mg/dL (ref 8.6–10.3)
Chloride: 103 mmol/L (ref 98–110)
Creat: 0.93 mg/dL (ref 0.60–1.29)
Globulin: 2.7 g/dL (calc) (ref 1.9–3.7)
Glucose, Bld: 77 mg/dL (ref 65–99)
Potassium: 4.2 mmol/L (ref 3.5–5.3)
Sodium: 140 mmol/L (ref 135–146)
Total Bilirubin: 2 mg/dL — ABNORMAL HIGH (ref 0.2–1.2)
Total Protein: 7.4 g/dL (ref 6.1–8.1)
eGFR: 103 mL/min/{1.73_m2} (ref >=60)

## 2022-05-01 LAB — LIPID PANEL W/REFLEX DIRECT LDL
Cholesterol: 180 mg/dL (ref <200)
HDL: 55 mg/dL (ref >=40)
LDL Cholesterol (Calc): 105 mg/dL (calc) — ABNORMAL HIGH
Non-HDL Cholesterol (Calc): 125 mg/dL (calc) (ref <130)
Total CHOL/HDL Ratio: 3.3 (calc) (ref <5.0)
Triglycerides: 103 mg/dL (ref <150)

## 2022-05-01 LAB — TSH: TSH: 1.2 mIU/L (ref 0.40–4.50)

## 2022-05-01 LAB — MAGNESIUM: Magnesium: 2.1 mg/dL (ref 1.5–2.5)

## 2022-05-01 NOTE — Progress Notes (Signed)
Donald Gillespie,  Magnesium normal.  Normal hemoglobin.  Normal thyroid.  TG look much better.  Kidney, liver, glucose look good.

## 2022-05-06 ENCOUNTER — Encounter: Payer: BC Managed Care – PPO | Admitting: Physician Assistant

## 2022-05-21 LAB — COLOGUARD: COLOGUARD: NEGATIVE

## 2022-05-22 NOTE — Progress Notes (Signed)
Negative cologuard. GREAT news. Repeat in 3 years.

## 2022-07-01 ENCOUNTER — Other Ambulatory Visit: Payer: Self-pay | Admitting: Physician Assistant

## 2022-08-09 ENCOUNTER — Other Ambulatory Visit: Payer: Self-pay | Admitting: Physician Assistant

## 2022-08-09 DIAGNOSIS — J45990 Exercise induced bronchospasm: Secondary | ICD-10-CM

## 2022-10-28 ENCOUNTER — Encounter: Payer: Self-pay | Admitting: Physician Assistant

## 2022-10-28 DIAGNOSIS — R040 Epistaxis: Secondary | ICD-10-CM

## 2023-02-10 ENCOUNTER — Other Ambulatory Visit: Payer: Self-pay | Admitting: Physician Assistant

## 2023-02-10 DIAGNOSIS — J45990 Exercise induced bronchospasm: Secondary | ICD-10-CM

## 2023-03-08 ENCOUNTER — Encounter: Payer: Self-pay | Admitting: Physician Assistant

## 2023-03-10 MED ORDER — BUDESONIDE 180 MCG/ACT IN AEPB: 1.0000 | INHALATION_SPRAY | Freq: Two times a day (BID) | RESPIRATORY_TRACT | 1 refills | Status: DC

## 2023-03-10 NOTE — Addendum Note (Signed)
Addended by: Jomarie Longs on: 03/10/2023 12:13 PM   Modules accepted: Orders

## 2023-04-01 ENCOUNTER — Other Ambulatory Visit: Payer: Self-pay | Admitting: Physician Assistant

## 2023-04-09 ENCOUNTER — Encounter: Payer: Self-pay | Admitting: Physician Assistant

## 2023-04-09 MED ORDER — PROAIR RESPICLICK 108 (90 BASE) MCG/ACT IN AEPB: INHALATION_SPRAY | RESPIRATORY_TRACT | 5 refills | Status: DC

## 2023-05-10 ENCOUNTER — Encounter: Payer: Self-pay | Admitting: Physician Assistant

## 2023-05-10 ENCOUNTER — Ambulatory Visit (INDEPENDENT_AMBULATORY_CARE_PROVIDER_SITE_OTHER): Payer: BC Managed Care – PPO | Admitting: Physician Assistant

## 2023-05-10 VITALS — BP 134/85 | HR 60 | Ht 69.0 in | Wt 207.0 lb

## 2023-05-10 DIAGNOSIS — J45990 Exercise induced bronchospasm: Secondary | ICD-10-CM | POA: Diagnosis not present

## 2023-05-10 DIAGNOSIS — J4520 Mild intermittent asthma, uncomplicated: Secondary | ICD-10-CM

## 2023-05-10 DIAGNOSIS — Z13220 Encounter for screening for lipoid disorders: Secondary | ICD-10-CM

## 2023-05-10 DIAGNOSIS — Z131 Encounter for screening for diabetes mellitus: Secondary | ICD-10-CM

## 2023-05-10 DIAGNOSIS — Z23 Encounter for immunization: Secondary | ICD-10-CM

## 2023-05-10 DIAGNOSIS — Z0000 Encounter for general adult medical examination without abnormal findings: Secondary | ICD-10-CM

## 2023-05-10 DIAGNOSIS — J301 Allergic rhinitis due to pollen: Secondary | ICD-10-CM

## 2023-05-10 DIAGNOSIS — F439 Reaction to severe stress, unspecified: Secondary | ICD-10-CM | POA: Insufficient documentation

## 2023-05-10 MED ORDER — BUDESONIDE 180 MCG/ACT IN AEPB
1.0000 | INHALATION_SPRAY | Freq: Two times a day (BID) | RESPIRATORY_TRACT | 3 refills | Status: DC
Start: 2023-05-10 — End: 2024-05-10

## 2023-05-10 MED ORDER — MONTELUKAST SODIUM 10 MG PO TABS
10.0000 mg | ORAL_TABLET | Freq: Every day | ORAL | 3 refills | Status: DC
Start: 2023-05-10 — End: 2024-05-10

## 2023-05-10 MED ORDER — CLONAZEPAM 0.5 MG PO TABS
0.5000 mg | ORAL_TABLET | Freq: Every day | ORAL | 1 refills | Status: DC
Start: 2023-05-10 — End: 2023-12-13

## 2023-05-10 MED ORDER — FLUTICASONE PROPIONATE 50 MCG/ACT NA SUSP
2.0000 | Freq: Every day | NASAL | 3 refills | Status: DC
Start: 2023-05-10 — End: 2024-05-10

## 2023-05-10 NOTE — Progress Notes (Unsigned)
Complete physical exam  Patient: Donald Gillespie   DOB: 04-09-1976   47 y.o. Male  MRN: [id number]  Subjective:    No chief complaint on file.   Donald Gillespie is a 47 y.o. male who presents today for a complete physical exam. He reports consuming a {diet types:17450} diet. {types:19826} He generally feels {DESC; WELL/FAIRLY WELL/POORLY:18703}. He reports sleeping {DESC; WELL/FAIRLY WELL/POORLY:18703}. He {does/does not:200015} have additional problems to discuss today.    Most recent fall risk assessment:    05/10/2023   10:29 AM  Fall Risk   Falls in the past year? 0  Number falls in past yr: 0  Injury with Fall? 0  Risk for fall due to : No Fall Risks  Follow up Falls evaluation completed;Follow up appointment     Most recent depression screenings:    05/10/2023    9:05 AM 04/30/2022    9:54 AM  PHQ 2/9 Scores  PHQ - 2 Score 2 0  PHQ- 9 Score 7 3    Vision:Within last year and Dental: No current dental problems and Receives regular dental care  Patient Active Problem List   Diagnosis Date Noted   Exercise-induced asthma 02/13/2022   Elevated LDL cholesterol level 04/30/2021   Class 1 obesity due to excess calories without serious comorbidity with body mass index (BMI) of 31.0 to 31.9 in adult 04/29/2021   Onychomycosis 02/26/2021   Great toe pain, right 02/26/2021   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   Hypertriglyceridemia 05/13/2020   Primary insomnia 05/13/2020   Mild intermittent asthma without complication 03/12/2020   Allergies 03/12/2020   Right shoulder pain 08/28/2011   Anxiety 09/01/2010   Asthma 09/01/2010   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   Past Medical History:  Diagnosis Date   Anxiety    Asthma    Depression    Seasonal allergies    Past Surgical History:  Procedure Laterality Date   CHOLECYSTECTOMY     VASECTOMY     Family Status  Relation Name Status   Mother  Alive   Father  Deceased       Unknown health hx   Other  (Not Specified)    Neg Hx  (Not Specified)  No partnership data on file   Family History  Problem Relation Age of Onset   Hypertension Mother    Breast cancer Other    Cancer Other    Diabetes Neg Hx    Heart attack Neg Hx    Hyperlipidemia Neg Hx    Allergies  Allergen Reactions   Amoxicillin-Pot Clavulanate Diarrhea   Clindamycin/Lincomycin Nausea And Vomiting   Promethazine Hcl       Patient Care Team: Nolene Ebbs as PCP - General (Family Medicine)   Outpatient Medications Prior to Visit  Medication Sig   Albuterol Sulfate (PROAIR RESPICLICK) 108 (90 Base) MCG/ACT AEPB 1-2 puffs as needed every 4-6 hours for shortness of breath/cough/wheezing.   cetirizine (ZYRTEC) 10 MG tablet Take 10 mg by mouth daily.   [DISCONTINUED] budesonide (PULMICORT) 180 MCG/ACT inhaler Inhale 1-2 puffs into the lungs 2 (two) times daily.   [DISCONTINUED] clonazePAM (KLONOPIN) 0.5 MG tablet TAKE 1 TABLET BY MOUTH 2 TIMES DAILY AS NEEDED.   [DISCONTINUED] fluticasone (FLONASE) 50 MCG/ACT nasal spray Place 2 sprays into both nostrils daily.   [DISCONTINUED] montelukast (SINGULAIR) 10 MG tablet TAKE 1 TABLET BY MOUTH EVERYDAY AT BEDTIME   No facility-administered medications prior to visit.    ROS  Objective:     BP 134/85   Pulse 60   Ht 5\' 9"  (1.753 m)   Wt 207 lb (93.9 kg)   SpO2 96%   BMI 30.57 kg/m  BP Readings from Last 3 Encounters:  05/10/23 134/85  04/30/22 111/77  02/13/22 130/80   Wt Readings from Last 3 Encounters:  05/10/23 207 lb (93.9 kg)  04/30/22 205 lb (93 kg)  02/13/22 197 lb (89.4 kg)      Physical Exam      Assessment & Plan:    Routine Health Maintenance and Physical Exam  Immunization History  Administered Date(s) Administered   Influenza Split 05/29/2014   Influenza, Seasonal, Injecte, Preservative Fre 05/10/2023   Influenza,inj,Quad PF,6+ Mos 05/13/2018, 05/13/2020, 04/29/2021, 04/30/2022   Influenza,inj,quad, With Preservative 06/05/2015,  05/06/2016, 05/12/2017   Influenza,trivalent, recombinat, inj, PF 05/29/2014   Influenza-Unspecified 05/10/2013, 06/05/2015, 05/06/2016, 05/12/2017   Moderna Sars-Covid-2 Vaccination 09/28/2019, 10/26/2019, 04/29/2021   Pneumococcal Polysaccharide-23 05/12/2017   Tdap 06/05/2015    Health Maintenance  Topic Date Due   COVID-19 Vaccine (4 - 2023-24 season) 05/26/2023 (Originally 03/28/2023)   Hepatitis C Screening  05/09/2024 (Originally 05/07/1993)   HIV Screening  05/09/2024 (Originally 05/07/1990)   Fecal DNA (Cologuard)  05/14/2025   DTaP/Tdap/Td (2 - Td or Tdap) 06/04/2025   INFLUENZA VACCINE  Completed   HPV VACCINES  Aged Out    Discussed health benefits of physical activity, and encouraged him to engage in regular exercise appropriate for his age and condition. ...ass Return in about 1 year (around 05/09/2024).     Tandy Gaw, PA-C

## 2023-05-10 NOTE — Patient Instructions (Signed)
Health Maintenance, Male Adopting a healthy lifestyle and getting preventive care are important in promoting health and wellness. Ask your health care provider about: The right schedule for you to have regular tests and exams. Things you can do on your own to prevent diseases and keep yourself healthy. What should I know about diet, weight, and exercise? Eat a healthy diet  Eat a diet that includes plenty of vegetables, fruits, low-fat dairy products, and lean protein. Do not eat a lot of foods that are high in solid fats, added sugars, or sodium. Maintain a healthy weight Body mass index (BMI) is a measurement that can be used to identify possible weight problems. It estimates body fat based on height and weight. Your health care provider can help determine your BMI and help you achieve or maintain a healthy weight. Get regular exercise Get regular exercise. This is one of the most important things you can do for your health. Most adults should: Exercise for at least 150 minutes each week. The exercise should increase your heart rate and make you sweat (moderate-intensity exercise). Do strengthening exercises at least twice a week. This is in addition to the moderate-intensity exercise. Spend less time sitting. Even light physical activity can be beneficial. Watch cholesterol and blood lipids Have your blood tested for lipids and cholesterol at 48 years of age, then have this test every 5 years. You may need to have your cholesterol levels checked more often if: Your lipid or cholesterol levels are high. You are older than 48 years of age. You are at high risk for heart disease. What should I know about cancer screening? Many types of cancers can be detected early and may often be prevented. Depending on your health history and family history, you may need to have cancer screening at various ages. This may include screening for: Colorectal cancer. Prostate cancer. Skin cancer. Lung  cancer. What should I know about heart disease, diabetes, and high blood pressure? Blood pressure and heart disease High blood pressure causes heart disease and increases the risk of stroke. This is more likely to develop in people who have high blood pressure readings or are overweight. Talk with your health care provider about your target blood pressure readings. Have your blood pressure checked: Every 3-5 years if you are 18-39 years of age. Every year if you are 40 years old or older. If you are between the ages of 65 and 75 and are a current or former smoker, ask your health care provider if you should have a one-time screening for abdominal aortic aneurysm (AAA). Diabetes Have regular diabetes screenings. This checks your fasting blood sugar level. Have the screening done: Once every three years after age 45 if you are at a normal weight and have a low risk for diabetes. More often and at a younger age if you are overweight or have a high risk for diabetes. What should I know about preventing infection? Hepatitis B If you have a higher risk for hepatitis B, you should be screened for this virus. Talk with your health care provider to find out if you are at risk for hepatitis B infection. Hepatitis C Blood testing is recommended for: Everyone born from 1945 through 1965. Anyone with known risk factors for hepatitis C. Sexually transmitted infections (STIs) You should be screened each year for STIs, including gonorrhea and chlamydia, if: You are sexually active and are younger than 48 years of age. You are older than 48 years of age and your   health care provider tells you that you are at risk for this type of infection. Your sexual activity has changed since you were last screened, and you are at increased risk for chlamydia or gonorrhea. Ask your health care provider if you are at risk. Ask your health care provider about whether you are at high risk for HIV. Your health care provider  may recommend a prescription medicine to help prevent HIV infection. If you choose to take medicine to prevent HIV, you should first get tested for HIV. You should then be tested every 3 months for as long as you are taking the medicine. Follow these instructions at home: Alcohol use Do not drink alcohol if your health care provider tells you not to drink. If you drink alcohol: Limit how much you have to 0-2 drinks a day. Know how much alcohol is in your drink. In the [place] one drink equals one 12 oz bottle of beer (355 mL), one 5 oz glass of wine (148 mL), or one 1 oz glass of hard liquor (44 mL). Lifestyle Do not use any products that contain nicotine or tobacco. These products include cigarettes, chewing tobacco, and vaping devices, such as e-cigarettes. If you need help quitting, ask your health care provider. Do not use street drugs. Do not share needles. Ask your health care provider for help if you need support or information about quitting drugs. General instructions Schedule regular health, dental, and eye exams. Stay current with your vaccines. Tell your health care provider if: You often feel depressed. You have ever been abused or do not feel safe at home. Summary Adopting a healthy lifestyle and getting preventive care are important in promoting health and wellness. Follow your health care provider's instructions about healthy diet, exercising, and getting tested or screened for diseases. Follow your health care provider's instructions on monitoring your cholesterol and blood pressure. This information is not intended to replace advice given to you by your health care provider. Make sure you discuss any questions you have with your health care provider. Document Revised: 12/02/2020 Document Reviewed: 12/02/2020 Elsevier Patient Education  2024 [employer] Inc.  

## 2023-05-11 ENCOUNTER — Encounter: Payer: Self-pay | Admitting: Physician Assistant

## 2023-05-11 LAB — CMP14+EGFR
ALT: 25 [IU]/L (ref 0–44)
AST: 26 [IU]/L (ref 0–40)
Albumin: 4.5 g/dL (ref 4.1–5.1)
Alkaline Phosphatase: 72 [IU]/L (ref 44–121)
BUN/Creatinine Ratio: 15 (ref 9–20)
BUN: 13 mg/dL (ref 6–24)
Bilirubin Total: 2.3 mg/dL — ABNORMAL HIGH (ref 0.0–1.2)
CO2: 20 mmol/L (ref 20–29)
Calcium: 9.5 mg/dL (ref 8.7–10.2)
Chloride: 106 mmol/L (ref 96–106)
Creatinine, Ser: 0.87 mg/dL (ref 0.76–1.27)
Globulin, Total: 2.1 g/dL (ref 1.5–4.5)
Glucose: 90 mg/dL (ref 70–99)
Potassium: 4.2 mmol/L (ref 3.5–5.2)
Sodium: 142 mmol/L (ref 134–144)
Total Protein: 6.6 g/dL (ref 6.0–8.5)
eGFR: 106 mL/min/{1.73_m2} (ref >59)

## 2023-05-11 LAB — CBC WITH DIFFERENTIAL/PLATELET
Basophils Absolute: 0 10*3/uL (ref 0.0–0.2)
Basos: 1 %
EOS (ABSOLUTE): 0.4 10*3/uL (ref 0.0–0.4)
Eos: 7 %
Hematocrit: 45.4 % (ref 37.5–51.0)
Hemoglobin: 15.3 g/dL (ref 13.0–17.7)
Immature Grans (Abs): 0 10*3/uL (ref 0.0–0.1)
Immature Granulocytes: 0 %
Lymphocytes Absolute: 1.6 10*3/uL (ref 0.7–3.1)
Lymphs: 29 %
MCH: 30.8 pg (ref 26.6–33.0)
MCHC: 33.7 g/dL (ref 31.5–35.7)
MCV: 92 fL (ref 79–97)
Monocytes Absolute: 0.5 10*3/uL (ref 0.1–0.9)
Monocytes: 8 %
Neutrophils Absolute: 3.2 10*3/uL (ref 1.4–7.0)
Neutrophils: 55 %
Platelets: 208 10*3/uL (ref 150–450)
RBC: 4.96 x10E6/uL (ref 4.14–5.80)
RDW: 13.2 % (ref 11.6–15.4)
WBC: 5.7 10*3/uL (ref 3.4–10.8)

## 2023-05-11 LAB — LIPID PANEL
Chol/HDL Ratio: 3.1 {ratio} (ref 0.0–5.0)
Cholesterol, Total: 160 mg/dL (ref 100–199)
HDL: 52 mg/dL (ref >39)
LDL Chol Calc (NIH): 92 mg/dL (ref 0–99)
Triglycerides: 86 mg/dL (ref 0–149)
VLDL Cholesterol Cal: 16 mg/dL (ref 5–40)

## 2023-05-11 LAB — TSH: TSH: 0.922 u[IU]/mL (ref 0.450–4.500)

## 2023-05-11 NOTE — Progress Notes (Signed)
Donald Gillespie,   Thyroid normal range.  Cholesterol looks great! Very low 10 year cardiovascular risk!   Marland KitchenMarland KitchenThe 10-year ASCVD risk score (Arnett DK, et al., 2019) is: 2.1%   Values used to calculate the score:     Age: 47 years     Sex: Male     Is Non-Hispanic African American: No     Diabetic: No     Tobacco smoker: No     Systolic Blood Pressure: 134 mmHg     Is BP treated: No     HDL Cholesterol: 52 mg/dL     Total Cholesterol: 160 mg/dL  Kidney, liver, glucose look good.   Great labs.

## 2023-06-02 ENCOUNTER — Encounter: Payer: Self-pay | Admitting: Physician Assistant

## 2023-06-09 ENCOUNTER — Ambulatory Visit: Payer: BC Managed Care – PPO | Admitting: Physician Assistant

## 2023-06-09 ENCOUNTER — Encounter: Payer: Self-pay | Admitting: Physician Assistant

## 2023-06-09 VITALS — BP 130/77 | HR 58 | Ht 69.0 in | Wt 204.0 lb

## 2023-06-09 DIAGNOSIS — M7711 Lateral epicondylitis, right elbow: Secondary | ICD-10-CM | POA: Diagnosis not present

## 2023-06-09 MED ORDER — DICLOFENAC SODIUM 1 % EX GEL
4.0000 g | Freq: Four times a day (QID) | CUTANEOUS | 0 refills | Status: DC
Start: 2023-06-09 — End: 2023-12-13

## 2023-06-09 MED ORDER — PREDNISONE 50 MG PO TABS
ORAL_TABLET | ORAL | 0 refills | Status: DC
Start: 2023-06-09 — End: 2023-12-13

## 2023-06-09 MED ORDER — TRAMADOL HCL 50 MG PO TABS
50.0000 mg | ORAL_TABLET | Freq: Three times a day (TID) | ORAL | 0 refills | Status: DC | PRN
Start: 2023-06-09 — End: 2024-05-23

## 2023-06-09 NOTE — Progress Notes (Signed)
   Acute Office Visit  Subjective:     Patient ID: Donald Gillespie, male    DOB: 1976-02-19, 47 y.o.   MRN: [id number]  Chief Complaint  Patient presents with   Medical Management of Chronic Issues    Rt arm pain x 3 weeks from moving     HPI Patient is in today for right arm injury occurring on 05/22/23. He was using a hand truck to move heavy loads while moving for 2 days straight. It has progressively gotten worse. Pain is located near lateral and anterior aspect of elbow. It is now a constant throbbing feeling. Patient also reports weakness with hand grip. Worse with wrist twisting motion. Has tried massage, ibuprofen, and aleve which has not helped. No tingling or numbness.     Review of Systems  Musculoskeletal:  Positive for myalgias.  Neurological:  Positive for weakness. Negative for tingling and sensory change.        Objective:    BP 130/77   Pulse (!) 58   Ht 5\' 9"  (1.753 m)   Wt 204 lb (92.5 kg)   SpO2 99%   BMI 30.13 kg/m  BP Readings from Last 3 Encounters:  06/09/23 130/77  05/10/23 134/85  04/30/22 111/77   Wt Readings from Last 3 Encounters:  06/09/23 204 lb (92.5 kg)  05/10/23 207 lb (93.9 kg)  04/30/22 205 lb (93 kg)      Physical Exam Cardiovascular:     Pulses: Normal pulses.  Musculoskeletal:     Right upper arm: Normal. No tenderness.     Left upper arm: Normal. No tenderness.     Right elbow: No swelling, deformity, effusion or lacerations. Normal range of motion. Tenderness present in lateral epicondyle.     Left elbow: Normal.     Right forearm: Normal.     Left forearm: Normal.     Comments: No TTP over biceps tendon. Normal strength. Pain with movement.          Assessment & Plan:  .Marland KitchenSig was seen today for medical management of chronic issues.  Diagnoses and all orders for this visit:  Right lateral epicondylitis -     predniSONE (DELTASONE) 50 MG tablet; Take one tablet daily for 5 days with food. -     traMADol  (ULTRAM) 50 MG tablet; Take 1 tablet (50 mg total) by mouth every 8 (eight) hours as needed for up to 5 days. -     diclofenac Sodium (VOLTAREN) 1 % GEL; Apply 4 g topically 4 (four) times daily. To affected joint.   Suspect overuse injury that resulted in lateral epicondylitis Burst of prednisone and discussed NOT to take NSAIDs with prednisone and to take prednisone with food.  Advised pt to ice for 15 min 2x/day Advised pt to wear tennis elbow strap brace for 2 wks. Stretches and exercises recommended 3-4 days after start wearing brace. Use voltaren gel over tender site up to 4 times a day.  If not better after prednisone, follow up and can consider more intervention.    Return in about 4 weeks (around 07/07/2023), or if symptoms worsen or fail to improve.  Tandy Gaw, PA-C

## 2023-06-09 NOTE — Patient Instructions (Signed)
Tennis Elbow  Tennis elbow (lateral epicondylitis) is inflammation of tendons in your outer forearm, near your elbow. Tendons are tissues that connect muscle to bone. When you have tennis elbow, inflammation affects the tendons that you use to bend your wrist and move your hand up. Inflammation occurs in the lower part of the upper arm bone (humerus), where the tendons connect to the bone (lateral epicondyle). Tennis elbow often affects people who play tennis, but anyone may get the condition from repeatedly extending the wrist or turning the forearm. What are the causes? This condition is usually caused by repeatedly extending the wrist, turning the forearm, and using the hands. It can result from sports or work that requires repetitive forearm movements. In some cases, it may be caused by a sudden injury. What increases the risk? You are more likely to develop tennis elbow if you play tennis or another racket sport. You also have a higher risk if you frequently use your hands for work. Besides people who play tennis, others at greater risk include: People who use computers. Construction workers. People who work in factories. Musicians. Cooks. Cashiers. What are the signs or symptoms? Symptoms of this condition include: Pain and tenderness in the forearm and the outer part of the elbow. Pain may be felt only when using the arm, or it may be there all the time. A burning feeling that starts in the elbow and spreads down the forearm. A weak grip in the hand. How is this diagnosed? This condition is diagnosed based on your symptoms, your medical history, and a physical exam. You may also have X-rays or an MRI to: Confirm the diagnosis. Look for other issues. Check for tears in the ligaments, muscles, or tendons. How is this treated? Resting and icing your arm is often the first treatment. Your health care provider may also recommend: Medicines to reduce pain and inflammation. These may be in  the form of a pill, topical gels, or shots of a steroid medicine (cortisone). An elbow strap to reduce stress on the area. Physical therapy. This may include massage or exercises or both. An elbow brace to restrict the movements that cause symptoms. If these treatments do not help relieve your symptoms, your health care provider may recommend surgery to remove damaged muscle and reattach healthy muscle to bone. Follow these instructions at home: If you have a brace or strap: Wear the brace or strap as told by your health care provider. Remove it only as told by your health care provider. Check the skin around the brace or strap every day. Tell your health care provider about any concerns. Loosen the brace if your fingers tingle, become numb, or turn cold and blue. Keep the brace clean. If the brace or strap is not waterproof: Do not let it get wet. Cover it with a watertight covering when you take a bath or a shower. Managing pain, stiffness, and swelling  If directed, put ice on the injured area. To do this: If you have a removable brace or strap, remove it as told by your health care provider. Put ice in a plastic bag. Place a towel between your skin and the bag. Leave the ice on for 20 minutes, 2-3 times a day. Remove the ice if your skin turns bright red. This is very important. If you cannot feel pain, heat, or cold, you have a greater risk of damage to the area. Move your fingers often to reduce stiffness and swelling. Activity Rest your elbow   and wrist and avoid activities that cause symptoms as told by your health care provider. Do physical therapy exercises as told by your health care provider. If you lift an object, lift it with your palm facing up. This reduces stress on your elbow. Lifestyle If your tennis elbow is caused by sports, check your equipment and make sure that: You use it correctly. It is good match for you. If your tennis elbow is caused by work or computer  use, take frequent breaks to stretch your arm. Talk with your employer about ways to manage your condition at work. General instructions Take over-the-counter and prescription medicines only as told by your health care provider. Do not use any products that contain nicotine or tobacco. These products include cigarettes, chewing tobacco, and vaping devices, such as e-cigarettes. If you need help quitting, ask your health care provider. Keep all follow-up visits. This is important. How is this prevented? Before and after activity: Warm up and stretch before being active. Cool down and stretch after being active. Give your body time to rest between periods of activity. During activity: Make sure to use equipment that fits you. If you play tennis, put power in your stroke with your lower body. Avoid using your arm only. Maintain physical fitness, including: Strength. Flexibility. Endurance. Do exercises to strengthen the forearm muscles. Contact a health care provider if: You have pain that gets worse or does not get better with treatment. You have numbness or weakness in your forearm, hand, or fingers. Get help right away if: Your pain is severe. You cannot move your wrist. Summary Tennis elbow (lateral epicondylitis) is inflammation of tendons in your outer forearm, near your elbow. Common symptoms include pain and tenderness in your forearm and the outer part of your elbow. This condition is usually caused by repeatedly extending your wrist, turning your forearm, and using your hands. The first treatment is often resting and icing your arm to relieve symptoms. Further treatment may include taking medicine, getting physical therapy, wearing a brace or strap, or having surgery. This information is not intended to replace advice given to you by your health care provider. Make sure you discuss any questions you have with your health care provider. Document Revised: 01/23/2020 Document  Reviewed: 01/23/2020 Elsevier Patient Education  2024 [employer]  

## 2023-06-18 ENCOUNTER — Encounter: Payer: Self-pay | Admitting: Physician Assistant

## 2023-06-18 MED ORDER — LIDOCAINE 5 % EX OINT: 1.0000 | TOPICAL_OINTMENT | Freq: Every day | CUTANEOUS | 0 refills | Status: DC

## 2023-06-18 MED ORDER — HYDROCORTISONE (PERIANAL) 2.5 % EX CREA: 1.0000 | TOPICAL_CREAM | Freq: Two times a day (BID) | CUTANEOUS | 0 refills | Status: DC

## 2023-12-11 ENCOUNTER — Encounter: Payer: Self-pay | Admitting: Physician Assistant

## 2023-12-13 ENCOUNTER — Encounter: Payer: Self-pay | Admitting: Family Medicine

## 2023-12-13 ENCOUNTER — Ambulatory Visit: Admitting: Family Medicine

## 2023-12-13 ENCOUNTER — Ambulatory Visit: Payer: Self-pay

## 2023-12-13 VITALS — BP 133/74 | HR 57 | Ht 69.0 in | Wt 201.1 lb

## 2023-12-13 DIAGNOSIS — L237 Allergic contact dermatitis due to plants, except food: Secondary | ICD-10-CM

## 2023-12-13 MED ORDER — TRIAMCINOLONE ACETONIDE 0.1 % EX CREA: 1.0000 | TOPICAL_CREAM | Freq: Two times a day (BID) | CUTANEOUS | 0 refills | Status: AC

## 2023-12-13 MED ORDER — PREDNISONE 20 MG PO TABS
40.0000 mg | ORAL_TABLET | Freq: Every day | ORAL | 0 refills | Status: DC
Start: 2023-12-13 — End: 2023-12-13

## 2023-12-13 MED ORDER — PREDNISONE 20 MG PO TABS
20.0000 mg | ORAL_TABLET | Freq: Every day | ORAL | 0 refills | Status: DC
Start: 2023-12-17 — End: 2024-05-10

## 2023-12-13 MED ORDER — TRIAMCINOLONE ACETONIDE 0.1 % EX CREA: 1.0000 | TOPICAL_CREAM | Freq: Two times a day (BID) | CUTANEOUS | 0 refills | Status: DC

## 2023-12-13 NOTE — Telephone Encounter (Signed)
  Chief Complaint: rash, red bumps Symptoms: itching Frequency: about 9 days Pertinent Negatives: Patient denies fever, drainage from bumps,  Disposition: [] ED /[] Urgent Care (no appt availability in office) / [x] Appointment(In office/virtual)/ []  [facility]/ [] Home Care/ [] Refused Recommended Disposition /[] [facility]/ []  Follow-up with PCP Additional Notes: Pt states that 9 days ago he was cleaning out a flower bed, did not note any poisonous plants in the bed. Pt states that shortly after completing the task he developed the rash. Pt states that he has utilized calamine, cortizone, and benedryl does not help. 2.5% hydrocortisone  makes it tolerable. Wife has potentially has gotten the rash from the pt. Pt scheduled today.  Copied from CRM [id number]. Topic: Clinical - Red Word Triage >> Dec 13, 2023 12:06 PM Danelle Dunning F wrote: Red Word that prompted transfer to Nurse Triage:   Raised bumps; itching is terrible; (please see patient message dated for 12/11/2023) Reason for Disposition . SEVERE itching (i.e., interferes with sleep, normal activities or school)  Answer Assessment - Initial Assessment Questions 1. APPEARANCE of RASH: "Describe the rash." (e.g., spots, blisters, raised areas, skin peeling, scaly)     Red bumps,  2. SIZE: "How big are the spots?" (e.g., tip of pen, eraser, coin; inches, centimeters)     Tip of pen most of them.  3. LOCATION: "Where is the rash located?"     everywhere 4. COLOR: "What color is the rash?" (Note: It is difficult to assess rash color in people with darker-colored skin. When this situation occurs, simply ask the caller to describe what they see.)     red 5. ONSET: "When did the rash begin?"     9 days ago 6. FEVER: "Do you have a fever?" If Yes, ask: "What is your temperature, how was it measured, and when did it start?"     denies 7. ITCHING: "Does the rash itch?" If Yes, ask: "How bad is the itch?" (Scale 1-10; or mild,  moderate, severe)     8 8. CAUSE: "What do you think is causing the rash?"     Unsure, was in flower bed, denies seeing any poisonous plants in there area 9. MEDICINE FACTORS: "Have you started any new medicines within the last 2 weeks?" (e.g., antibiotics)      denies 10. OTHER SYMPTOMS: "Do you have any other symptoms?" (e.g., dizziness, headache, sore throat, joint pain)       denies  Protocols used: Rash or Redness - [facility]

## 2023-12-13 NOTE — Progress Notes (Signed)
   Acute Office Visit  Subjective:     Patient ID: Donald Gillespie, male    DOB: Jul 03, 1976, 48 y.o.   MRN: [id number]  Chief Complaint  Patient presents with   Rash    HPI Patient is in today for Rash that started about 4 hours after dug up some rose bushes and was using the tiller. very itchy and tingles.   Also c/o of left base heel pain. Has been doing some dorisflexion stretches and that does seem to help.   ROS      Objective:    BP 133/74   Pulse (!) 57   Ht 5\' 9"  (1.753 m)   Wt 201 lb 1.3 oz (91.2 kg)   SpO2 100%   BMI 29.69 kg/m    Physical Exam       No results found for any visits on 12/13/23.      Assessment & Plan:   Problem List Items Addressed This Visit   None Visit Diagnoses       Allergic contact dermatitis due to plants, except food    -  Primary   Relevant Medications   triamcinolone  cream (KENALOG ) 0.1 %   predniSONE  (DELTASONE ) 20 MG tablet (Start on 12/17/2023)       Strongly suspect either poison ivy or poison oak.  Some of the lesions are papular and some have a mostly linear line to them.  And then he has a large confluent patch on his left upper thigh just above the knee he says initially it started out as multiple small spots but now it has coalesced.  Will treat with oral prednisone  since he has such an extensive area on his legs upper and lower bilaterally as well as his forearms and has even had a few spots pop up on his chest as well.  I will resend the triamcinolone  cream to his new pharmacy since he moved recently.  If not improving then please let us  know.  Meds ordered this encounter  Medications   DISCONTD: predniSONE  (DELTASONE ) 20 MG tablet    Sig: Take 2 tablets (40 mg total) by mouth daily with breakfast.    Dispense:  10 tablet    Refill:  0   triamcinolone  cream (KENALOG ) 0.1 %    Sig: Apply 1 Application topically 2 (two) times daily.    Dispense:  80 g    Refill:  0   predniSONE  (DELTASONE ) 20 MG tablet     Sig: Take 1 tablet (20 mg total) by mouth daily with breakfast.    Dispense:  5 tablet    Refill:  0    No follow-ups on file.  Duaine German, MD

## 2024-01-18 ENCOUNTER — Telehealth: Payer: Self-pay

## 2024-01-18 ENCOUNTER — Other Ambulatory Visit (HOSPITAL_COMMUNITY): Payer: Self-pay

## 2024-01-18 ENCOUNTER — Other Ambulatory Visit: Payer: Self-pay | Admitting: Physician Assistant

## 2024-01-18 NOTE — Telephone Encounter (Signed)
 Pharmacy Patient Advocate Encounter   Received notification from RX Request Messages that prior authorization for Proair  RespiClick 108  is required/requested.   Insurance verification completed.   The patient is insured through [facility] .   Per test claim:  Levalbuterol 45mcg or Albuterol  108 is preferred by the insurance.  If suggested medication is appropriate, Please send in a new RX and discontinue this one. If not, please advise as to why it's not appropriate so that we may request a Prior Authorization. Please note, some preferred medications may still require a PA.  If the suggested medications have not been trialed and there are no contraindications to their use, the PA will not be submitted, as it will not be approved.   Levalbuterol 45mcg copay is $8Albuterol  108 copay is $8

## 2024-05-10 ENCOUNTER — Ambulatory Visit: Admitting: Physician Assistant

## 2024-05-10 ENCOUNTER — Encounter: Payer: Self-pay | Admitting: Physician Assistant

## 2024-05-10 VITALS — BP 128/66 | HR 63 | Ht 69.0 in | Wt 198.0 lb

## 2024-05-10 DIAGNOSIS — Z23 Encounter for immunization: Secondary | ICD-10-CM

## 2024-05-10 DIAGNOSIS — Z0000 Encounter for general adult medical examination without abnormal findings: Secondary | ICD-10-CM

## 2024-05-10 DIAGNOSIS — J45990 Exercise induced bronchospasm: Secondary | ICD-10-CM

## 2024-05-10 DIAGNOSIS — Z131 Encounter for screening for diabetes mellitus: Secondary | ICD-10-CM

## 2024-05-10 DIAGNOSIS — J301 Allergic rhinitis due to pollen: Secondary | ICD-10-CM

## 2024-05-10 DIAGNOSIS — J4520 Mild intermittent asthma, uncomplicated: Secondary | ICD-10-CM | POA: Diagnosis not present

## 2024-05-10 DIAGNOSIS — Z13220 Encounter for screening for lipoid disorders: Secondary | ICD-10-CM

## 2024-05-10 DIAGNOSIS — Z125 Encounter for screening for malignant neoplasm of prostate: Secondary | ICD-10-CM

## 2024-05-10 MED ORDER — FLUTICASONE PROPIONATE 50 MCG/ACT NA SUSP: 2.0000 | Freq: Every day | NASAL | 3 refills | Status: AC

## 2024-05-10 MED ORDER — AIRSUPRA 90-80 MCG/ACT IN AERO: 2.0000 | INHALATION_SPRAY | Freq: Four times a day (QID) | RESPIRATORY_TRACT | 1 refills | Status: AC | PRN

## 2024-05-10 MED ORDER — BUDESONIDE 180 MCG/ACT IN AEPB: 1.0000 | INHALATION_SPRAY | Freq: Two times a day (BID) | RESPIRATORY_TRACT | 3 refills | Status: DC

## 2024-05-10 MED ORDER — MONTELUKAST SODIUM 10 MG PO TABS: 10.0000 mg | ORAL_TABLET | Freq: Every day | ORAL | 3 refills | Status: AC

## 2024-05-10 NOTE — Patient Instructions (Signed)
 Health Maintenance, Male  Adopting a healthy lifestyle and getting preventive care are important in promoting health and wellness. Ask your health care provider about:  The right schedule for you to have regular tests and exams.  Things you can do on your own to prevent diseases and keep yourself healthy.  What should I know about diet, weight, and exercise?  Eat a healthy diet    Eat a diet that includes plenty of vegetables, fruits, low-fat dairy products, and lean protein.  Do not eat a lot of foods that are high in solid fats, added sugars, or sodium.  Maintain a healthy weight  Body mass index (BMI) is a measurement that can be used to identify possible weight problems. It estimates body fat based on height and weight. Your health care provider can help determine your BMI and help you achieve or maintain a healthy weight.  Get regular exercise  Get regular exercise. This is one of the most important things you can do for your health. Most adults should:  Exercise for at least 150 minutes each week. The exercise should increase your heart rate and make you sweat (moderate-intensity exercise).  Do strengthening exercises at least twice a week. This is in addition to the moderate-intensity exercise.  Spend less time sitting. Even light physical activity can be beneficial.  Watch cholesterol and blood lipids  Have your blood tested for lipids and cholesterol at 49 years of age, then have this test every 5 years.  You may need to have your cholesterol levels checked more often if:  Your lipid or cholesterol levels are high.  You are older than 49 years of age.  You are at high risk for heart disease.  What should I know about cancer screening?  Many types of cancers can be detected early and may often be prevented. Depending on your health history and family history, you may need to have cancer screening at various ages. This may include screening for:  Colorectal cancer.  Prostate cancer.  Skin cancer.  Lung  cancer.  What should I know about heart disease, diabetes, and high blood pressure?  Blood pressure and heart disease  High blood pressure causes heart disease and increases the risk of stroke. This is more likely to develop in people who have high blood pressure readings or are overweight.  Talk with your health care provider about your target blood pressure readings.  Have your blood pressure checked:  Every 3-5 years if you are 24-52 years of age.  Every year if you are 3 years old or older.  If you are between the ages of 60 and 72 and are a current or former smoker, ask your health care provider if you should have a one-time screening for abdominal aortic aneurysm (AAA).  Diabetes  Have regular diabetes screenings. This checks your fasting blood sugar level. Have the screening done:  Once every three years after age 66 if you are at a normal weight and have a low risk for diabetes.  More often and at a younger age if you are overweight or have a high risk for diabetes.  What should I know about preventing infection?  Hepatitis B  If you have a higher risk for hepatitis B, you should be screened for this virus. Talk with your health care provider to find out if you are at risk for hepatitis B infection.  Hepatitis C  Blood testing is recommended for:  Everyone born from [birth year] through 1966.  Anyone  with known risk factors for hepatitis C.  Sexually transmitted infections (STIs)  You should be screened each year for STIs, including gonorrhea and chlamydia, if:  You are sexually active and are younger than 49 years of age.  You are older than 49 years of age and your health care provider tells you that you are at risk for this type of infection.  Your sexual activity has changed since you were last screened, and you are at increased risk for chlamydia or gonorrhea. Ask your health care provider if you are at risk.  Ask your health care provider about whether you are at high risk for HIV. Your health care provider  may recommend a prescription medicine to help prevent HIV infection. If you choose to take medicine to prevent HIV, you should first get tested for HIV. You should then be tested every 3 months for as long as you are taking the medicine.  Follow these instructions at home:  Alcohol use  Do not drink alcohol if your health care provider tells you not to drink.  If you drink alcohol:  Limit how much you have to 0-2 drinks a day.  Know how much alcohol is in your drink. In the [place] one drink equals one 12 oz bottle of beer (355 mL), one 5 oz glass of wine (148 mL), or one 1 oz glass of hard liquor (44 mL).  Lifestyle  Do not use any products that contain nicotine or tobacco. These products include cigarettes, chewing tobacco, and vaping devices, such as e-cigarettes. If you need help quitting, ask your health care provider.  Do not use street drugs.  Do not share needles.  Ask your health care provider for help if you need support or information about quitting drugs.  General instructions  Schedule regular health, dental, and eye exams.  Stay current with your vaccines.  Tell your health care provider if:  You often feel depressed.  You have ever been abused or do not feel safe at home.  Summary  Adopting a healthy lifestyle and getting preventive care are important in promoting health and wellness.  Follow your health care provider's instructions about healthy diet, exercising, and getting tested or screened for diseases.  Follow your health care provider's instructions on monitoring your cholesterol and blood pressure.  This information is not intended to replace advice given to you by your health care provider. Make sure you discuss any questions you have with your health care provider.  Document Revised: 12/02/2020 Document Reviewed: 12/02/2020  Elsevier Patient Education  2024 [employer].

## 2024-05-10 NOTE — Progress Notes (Signed)
 Annual Wellness Visit     Patient: Donald Gillespie, Male    DOB: 01/06/1976, 48 y.o.   MRN: [id number]  Subjective  Chief Complaint  Patient presents with   Annual Exam    Travonne Schowalter is a 48 y.o. male who presents today for his Annual Wellness Visit. Pt reports having a normal diet and eating well. He currently is training for a half marathon for early November and runs about 12 miles two times a week. He does complain of some  Pt states he has been having trouble with sleeping due to depression and anxiety from some personal troubles. He states he feels his asthma is under control with his current medication regimen. No other complaints noted at his time.     Past Medical History:  Diagnosis Date   Allergy 07/27/1978   Anxiety    Asthma    Depression    Seasonal allergies     Medications: Outpatient Medications Prior to Visit  Medication Sig   cetirizine (ZYRTEC) 10 MG tablet Take 10 mg by mouth daily.   triamcinolone  cream (KENALOG ) 0.1 % Apply 1 Application topically 2 (two) times daily.   levalbuterol (XOPENEX HFA) 45 MCG/ACT inhaler  (Patient not taking: Reported on 05/10/2024)   [DISCONTINUED] budesonide  (PULMICORT ) 180 MCG/ACT inhaler Inhale 1-2 puffs into the lungs 2 (two) times daily.   [DISCONTINUED] fluticasone  (FLONASE ) 50 MCG/ACT nasal spray Place 2 sprays into both nostrils daily.   [DISCONTINUED] montelukast  (SINGULAIR ) 10 MG tablet Take 1 tablet (10 mg total) by mouth at bedtime.   [DISCONTINUED] predniSONE  (DELTASONE ) 20 MG tablet Take 1 tablet (20 mg total) by mouth daily with breakfast.   No facility-administered medications prior to visit.    Allergies  Allergen Reactions   Amoxicillin-Pot Clavulanate Diarrhea   Clindamycin/Lincomycin Nausea And Vomiting   Promethazine Hcl     Patient Care Team: Trey Gulbranson L, PA-C as PCP - General (Family Medicine)  Review of Systems  Musculoskeletal:        Upper thigh/leg pain noted after running. Pt  plans to begin strength training along with cardio  Psychiatric/Behavioral:  Positive for depression. The patient is nervous/anxious and has insomnia.   All other systems reviewed and are negative.       Objective  BP 128/66   Pulse 63   Ht 5' 9 (1.753 m)   Wt 198 lb (89.8 kg)   SpO2 100%   BMI 29.24 kg/m  BP Readings from Last 3 Encounters:  05/10/24 128/66  12/13/23 133/74  06/09/23 130/77   Wt Readings from Last 3 Encounters:  05/10/24 198 lb (89.8 kg)  12/13/23 201 lb 1.3 oz (91.2 kg)  06/09/23 204 lb (92.5 kg)      Physical Exam Constitutional:      Appearance: Normal appearance. He is obese.  HENT:     Head: Normocephalic and atraumatic.     Nose: Nose normal.  Cardiovascular:     Rate and Rhythm: Normal rate and regular rhythm.     Pulses: Normal pulses.     Heart sounds: Normal heart sounds.  Pulmonary:     Effort: Pulmonary effort is normal.     Breath sounds: Normal breath sounds.  Musculoskeletal:        General: Normal range of motion.  Neurological:     General: No focal deficit present.     Mental Status: He is alert and oriented to person, place, and time.  Psychiatric:  Mood and Affect: Mood normal.        Behavior: Behavior normal.        Thought Content: Thought content normal.       Most recent fall risk assessment:    05/12/2024    5:51 AM  Fall Risk   Falls in the past year? 0  Number falls in past yr: 0  Injury with Fall? 0  Risk for fall due to : No Fall Risks  Follow up Falls evaluation completed    Most recent depression screenings:    05/10/2024    7:39 AM 05/10/2023    9:05 AM  PHQ 2/9 Scores  PHQ - 2 Score 4 2  PHQ- 9 Score 7 7    Most recent Audit-C alcohol use screening    05/06/2024    6:59 PM  Alcohol Use Disorder Test (AUDIT)  1. How often do you have a drink containing alcohol? 2  2. How many drinks containing alcohol do you have on a typical day when you are drinking? 0  3. How often do you  have six or more drinks on one occasion? 0  AUDIT-C Score 2      Patient-reported   A score of 3 or more in women, and 4 or more in men indicates increased risk for alcohol abuse, EXCEPT if all of the points are from question 1       Assessment & Plan     Immunization History  Administered Date(s) Administered   Influenza Split 05/29/2014   Influenza, Seasonal, Injecte, Preservative Fre 05/10/2023, 05/10/2024   Influenza,inj,Quad PF,6+ Mos 05/13/2018, 05/13/2020, 04/29/2021, 04/30/2022   Influenza,inj,quad, With Preservative 06/05/2015, 05/06/2016, 05/12/2017   Influenza,trivalent, recombinat, inj, PF 05/29/2014   Influenza-Unspecified 05/10/2013, 06/05/2015, 05/06/2016, 05/12/2017   Moderna Sars-Covid-2 Vaccination 09/28/2019, 10/26/2019, 04/29/2021   PNEUMOCOCCAL CONJUGATE-20 05/10/2024   Pneumococcal Polysaccharide-23 05/12/2017   Tdap 06/05/2015    Health Maintenance  Topic Date Due   HIV Screening  Never done   Hepatitis C Screening  Never done   Hepatitis B Vaccines 19-59 Average Risk (1 of 3 - 19+ 3-dose series) Never done   COVID-19 Vaccine (4 - 2025-26 season) 05/28/2024 (Originally 03/27/2024)   Fecal DNA (Cologuard)  05/14/2025   DTaP/Tdap/Td (2 - Td or Tdap) 06/04/2025   Pneumococcal Vaccine  Completed   Influenza Vaccine  Completed   HPV VACCINES  Aged Out   Meningococcal B Vaccine  Aged Out     Discussed health benefits of physical activity, and encouraged him to engage in regular exercise appropriate for his age and condition.    SABRAMelvern was seen today for annual exam.  Diagnoses and all orders for this visit:  Annual physical exam -     CBC with Differential/Platelet -     CMP14+EGFR -     Lipid panel -     PSA, total and free -     TSH  Immunization due -     Flu vaccine trivalent PF, 6mos and older(Flulaval,Afluria,Fluarix,Fluzone) -     Pneumococcal conjugate vaccine 20-valent (Prevnar 20)  Screening for diabetes mellitus -      CMP14+EGFR  Screening for lipid disorders -     Lipid panel  Prostate cancer screening -     PSA, total and free  Seasonal allergic rhinitis due to pollen -     fluticasone  (FLONASE ) 50 MCG/ACT nasal spray; Place 2 sprays into both nostrils daily.  Mild intermittent asthma without complication -  budesonide  (PULMICORT ) 180 MCG/ACT inhaler; Inhale 1-2 puffs into the lungs 2 (two) times daily. -     Albuterol -Budesonide  (AIRSUPRA) 90-80 MCG/ACT AERO; Inhale 2 puffs into the lungs every 6 (six) hours as needed.  Exercise-induced asthma -     montelukast  (SINGULAIR ) 10 MG tablet; Take 1 tablet (10 mg total) by mouth at bedtime. -     Albuterol -Budesonide  (AIRSUPRA) 90-80 MCG/ACT AERO; Inhale 2 puffs into the lungs every 6 (six) hours as needed.   SABRA.Start a regular exercise program and make sure you are eating a healthy diet Try to eat 4 servings of dairy a day or take a calcium supplement (500mg  twice a day). - fasting labs ordered today - cologuard UTD - Flu and Pneumonia vaccine given today - singular and pulmicort  refilled, switched out albuterol  for airsupra and given coupon card  PHQ-9 of 7/Insomnia  Mostly situational due to problems at home with wife Discussed natural remedies to aid with sleep Consider trial of Trazodone to try at night.   Post-Exercise Leg Pain  Discussed the importance of strength training during time of marathon training  Please return if pain worsens     Vermell Bologna, PA-C

## 2024-05-10 NOTE — Progress Notes (Deleted)
 Complete physical exam  Patient: Donald Gillespie   DOB: 1976/05/18   48 y.o. Male  MRN: [id number]  Subjective:    Chief Complaint  Patient presents with   Annual Exam    [name] is a 48 y.o. male who presents today for a complete physical exam. He reports consuming a {diet types:17450} diet. {types:19826} He generally feels {DESC; WELL/FAIRLY WELL/POORLY:18703}. He reports sleeping {DESC; WELL/FAIRLY WELL/POORLY:18703}. He {does/does not:200015} have additional problems to discuss today.    Most recent fall risk assessment:    05/10/2023   10:29 AM  Fall Risk   Falls in the past year? 0  Number falls in past yr: 0  Injury with Fall? 0  Risk for fall due to : No Fall Risks  Follow up Falls evaluation completed;Follow up appointment     Most recent depression screenings:    05/10/2024    7:39 AM 05/10/2023    9:05 AM  PHQ 2/9 Scores  PHQ - 2 Score 4 2  PHQ- 9 Score 7 7    {VISON DENTAL STD PSA (Optional):27386}  {History (Optional):23778}  Patient Care Team: Ingvald Theisen L, PA-C as PCP - General (Family Medicine)   Outpatient Medications Prior to Visit  Medication Sig   budesonide  (PULMICORT ) 180 MCG/ACT inhaler Inhale 1-2 puffs into the lungs 2 (two) times daily.   cetirizine (ZYRTEC) 10 MG tablet Take 10 mg by mouth daily.   fluticasone  (FLONASE ) 50 MCG/ACT nasal spray Place 2 sprays into both nostrils daily.   levalbuterol (XOPENEX HFA) 45 MCG/ACT inhaler    montelukast  (SINGULAIR ) 10 MG tablet Take 1 tablet (10 mg total) by mouth at bedtime.   triamcinolone  cream (KENALOG ) 0.1 % Apply 1 Application topically 2 (two) times daily.   [DISCONTINUED] predniSONE  (DELTASONE ) 20 MG tablet Take 1 tablet (20 mg total) by mouth daily with breakfast.   No facility-administered medications prior to visit.    ROS        Objective:     BP 128/66   Pulse 63   Ht 5' 9 (1.753 m)   Wt 198 lb (89.8 kg)   SpO2 100%   BMI 29.24 kg/m  {Vitals History  (Optional):23777}  Physical Exam   No results found for any visits on 05/10/24. {Show previous labs (optional):23779}    Assessment & Plan:    Routine Health Maintenance and Physical Exam  Immunization History  Administered Date(s) Administered   Influenza Split 05/29/2014   Influenza, Seasonal, Injecte, Preservative Fre 05/10/2023, 05/10/2024   Influenza,inj,Quad PF,6+ Mos 05/13/2018, 05/13/2020, 04/29/2021, 04/30/2022   Influenza,inj,quad, With Preservative 06/05/2015, 05/06/2016, 05/12/2017   Influenza,trivalent, recombinat, inj, PF 05/29/2014   Influenza-Unspecified 05/10/2013, 06/05/2015, 05/06/2016, 05/12/2017   Moderna Sars-Covid-2 Vaccination 09/28/2019, 10/26/2019, 04/29/2021   PNEUMOCOCCAL CONJUGATE-20 05/10/2024   Pneumococcal Polysaccharide-23 05/12/2017   Tdap 06/05/2015    Health Maintenance  Topic Date Due   HIV Screening  Never done   Hepatitis C Screening  Never done   Hepatitis B Vaccines 19-59 Average Risk (1 of 3 - 19+ 3-dose series) Never done   COVID-19 Vaccine (4 - 2025-26 season) 03/27/2024   Fecal DNA (Cologuard)  05/14/2025   DTaP/Tdap/Td (2 - Td or Tdap) 06/04/2025   Pneumococcal Vaccine  Completed   Influenza Vaccine  Completed   HPV VACCINES  Aged Out   Meningococcal B Vaccine  Aged Out    Discussed health benefits of physical activity, and encouraged him to engage in regular exercise appropriate for his age and condition.  Problem List Items Addressed This Visit   None Visit Diagnoses       Annual physical exam    -  Primary   Relevant Orders   CBC with Differential/Platelet   CMP14+EGFR   Lipid panel   PSA, total and free   TSH     Immunization due       Relevant Orders   Flu vaccine trivalent PF, 6mos and older(Flulaval,Afluria,Fluarix,Fluzone) (Completed)   Pneumococcal conjugate vaccine 20-valent (Prevnar 20) (Completed)     Screening for diabetes mellitus       Relevant Orders   CMP14+EGFR     Screening for lipid disorders        Relevant Orders   Lipid panel     Prostate cancer screening       Relevant Orders   PSA, total and free      No follow-ups on file.     Rivky Clendenning, PA-C

## 2024-05-11 LAB — CMP14+EGFR
ALT: 22 IU/L (ref 0–44)
AST: 23 IU/L (ref 0–40)
Albumin: 4.4 g/dL (ref 4.1–5.1)
Alkaline Phosphatase: 71 IU/L (ref 47–123)
BUN/Creatinine Ratio: 12 (ref 9–20)
BUN: 10 mg/dL (ref 6–24)
Bilirubin Total: 2.3 mg/dL — ABNORMAL HIGH (ref 0.0–1.2)
CO2: 24 mmol/L (ref 20–29)
Calcium: 9.7 mg/dL (ref 8.7–10.2)
Chloride: 101 mmol/L (ref 96–106)
Creatinine, Ser: 0.86 mg/dL (ref 0.76–1.27)
Globulin, Total: 2.4 g/dL (ref 1.5–4.5)
Glucose: 86 mg/dL (ref 70–99)
Potassium: 4.2 mmol/L (ref 3.5–5.2)
Sodium: 140 mmol/L (ref 134–144)
Total Protein: 6.8 g/dL (ref 6.0–8.5)
eGFR: 106 mL/min/1.73 (ref >59)

## 2024-05-11 LAB — PSA, TOTAL AND FREE
PSA, Free Pct: 19 %
PSA, Free: 0.19 ng/mL
Prostate Specific Ag, Serum: 1 ng/mL (ref 0.0–4.0)

## 2024-05-11 LAB — CBC WITH DIFFERENTIAL/PLATELET
Basophils Absolute: 0 x10E3/uL (ref 0.0–0.2)
Basos: 1 %
EOS (ABSOLUTE): 0.1 x10E3/uL (ref 0.0–0.4)
Eos: 2 %
Hematocrit: 45.5 % (ref 37.5–51.0)
Hemoglobin: 14.9 g/dL (ref 13.0–17.7)
Immature Grans (Abs): 0 x10E3/uL (ref 0.0–0.1)
Immature Granulocytes: 0 %
Lymphocytes Absolute: 1.5 x10E3/uL (ref 0.7–3.1)
Lymphs: 24 %
MCH: 30.8 pg (ref 26.6–33.0)
MCHC: 32.7 g/dL (ref 31.5–35.7)
MCV: 94 fL (ref 79–97)
Monocytes Absolute: 0.6 x10E3/uL (ref 0.1–0.9)
Monocytes: 10 %
Neutrophils Absolute: 3.9 x10E3/uL (ref 1.4–7.0)
Neutrophils: 63 %
Platelets: 237 x10E3/uL (ref 150–450)
RBC: 4.84 x10E6/uL (ref 4.14–5.80)
RDW: 12.7 % (ref 11.6–15.4)
WBC: 6.1 x10E3/uL (ref 3.4–10.8)

## 2024-05-11 LAB — LIPID PANEL
Chol/HDL Ratio: 3.3 ratio (ref 0.0–5.0)
Cholesterol, Total: 158 mg/dL (ref 100–199)
HDL: 48 mg/dL (ref >39)
LDL Chol Calc (NIH): 88 mg/dL (ref 0–99)
Triglycerides: 126 mg/dL (ref 0–149)
VLDL Cholesterol Cal: 22 mg/dL (ref 5–40)

## 2024-05-11 LAB — TSH: TSH: 1.07 u[IU]/mL (ref 0.450–4.500)

## 2024-05-12 ENCOUNTER — Encounter: Payer: Self-pay | Admitting: Physician Assistant

## 2024-05-12 ENCOUNTER — Ambulatory Visit: Payer: Self-pay | Admitting: Physician Assistant

## 2024-05-12 NOTE — Progress Notes (Signed)
 Donald Gillespie.   Labs look GREAT.  Cholesterol fabulous.

## 2024-05-23 ENCOUNTER — Encounter: Payer: Self-pay | Admitting: Physician Assistant

## 2024-05-23 DIAGNOSIS — M7711 Lateral epicondylitis, right elbow: Secondary | ICD-10-CM

## 2024-05-23 MED ORDER — TRAMADOL HCL 50 MG PO TABS: 50.0000 mg | ORAL_TABLET | Freq: Two times a day (BID) | ORAL | 0 refills | Status: AC | PRN

## 2024-06-09 ENCOUNTER — Encounter: Payer: Self-pay | Admitting: Physician Assistant

## 2024-06-09 NOTE — Telephone Encounter (Signed)
 Patient scheduled.

## 2024-06-12 ENCOUNTER — Ambulatory Visit

## 2024-06-12 ENCOUNTER — Encounter: Payer: Self-pay | Admitting: Physician Assistant

## 2024-06-12 ENCOUNTER — Ambulatory Visit: Admitting: Physician Assistant

## 2024-06-12 ENCOUNTER — Ambulatory Visit: Payer: Self-pay | Admitting: Physician Assistant

## 2024-06-12 VITALS — BP 132/70 | HR 72 | Ht 69.0 in | Wt 199.0 lb

## 2024-06-12 DIAGNOSIS — M25562 Pain in left knee: Secondary | ICD-10-CM | POA: Diagnosis not present

## 2024-06-12 DIAGNOSIS — M7632 Iliotibial band syndrome, left leg: Secondary | ICD-10-CM

## 2024-06-12 NOTE — Patient Instructions (Signed)
Patellofemoral Pain Syndrome  Patellofemoral pain syndrome or PFPS is a condition that causes pain in front of the knee and around the kneecap. The kneecap is also called the patella. Your kneecap covers the front of the knee and is attached to muscles above and below the knee. PFPS can be caused by many things. PFPS is most common in active young adults. What are the causes? PFPS may be caused by: Using your knee too much. The smooth tissue that is called cartilage on the underside of the kneecap breaking down. This is called chondromalacia patella or runner's knee. Your knee joint not being aligned well. Weak leg muscles. A hit to your kneecap. What increases the risk? You are more likely to develop PFPS if: You do a lot of activities over and over again that can wear down your kneecap. These include: Running. Squatting. Climbing stairs. You wear shoes that do not fit well. You do not have a lot of leg strength. You are overweight. What are the signs or symptoms? The main symptom of PFPS is knee pain. This may feel like a dull pain under your kneecap. There may be a popping or cracking sound when you move your knee. Pain may get worse when you: Exercise. Climb stairs. Run. Jump. Squat. Kneel. Sit for a long time. Move or push on your kneecap. How is this diagnosed? PFPS may be diagnosed based on: Your symptoms and medical history. You may be asked about your activities and which ones cause knee pain. A physical exam. This may include: Moving your kneecap back and forth. Checking how you move your knee. Having you squat or jump to see if you have pain. Checking the strength of your leg muscles. Imaging tests. These may include an MRI of your knee. How is this treated? PFPS may be treated at home with rest, ice, pressure (compression), and elevation. This is called RICE therapy. Other treatments may include: NSAIDs, such as ibuprofen. Doing physical therapy exercises to  improve movement and strength in your leg. Shoe inserts to take stress off your knee. Sports tape to support the kneecap. Surgery to remove damaged tissue or move the kneecap to a better position. This is rare. Follow these instructions at home: Managing pain, stiffness, and swelling  If told, put ice on the area. Put ice in a plastic bag. Place a towel between your skin and the bag. Leave the ice on for 20 minutes, 2-3 times a day. If your skin turns bright red, take off the ice right away to prevent skin damage. The risk of damage is higher if you can't feel pain, heat, or cold. Move your toes often to reduce stiffness and swelling. Raisethe injured area above the level of your heart while you are sitting or lying down. Activity Rest your knee as told by your health care provider. Avoid activities that cause knee pain. Do stretching and strengthening exercises as told by your provider or physical therapist. Return to your normal activities as told by your provider. Ask your provider what activities are safe for you. General instructions Take over-the-counter and prescription medicines only as told by your provider. Use shoe inserts as told. Put sports tape on your knee as told. Do not use any products that contain nicotine or tobacco. These products include cigarettes, chewing tobacco, and vaping devices, such as e-cigarettes. If you need help quitting, ask your provider. Keep all follow-up visits. Your provider will watch your pain and try other treatments if needed. Contact a  health care provider if: Your symptoms get worse. Your knee pain does not get better with home care. This information is not intended to replace advice given to you by your health care provider. Make sure you discuss any questions you have with your health care provider. Document Revised: 09/10/2022 Document Reviewed: 09/10/2022 Elsevier Patient Education  2024 [employer].

## 2024-06-12 NOTE — Progress Notes (Signed)
 "  Acute Office Visit  Subjective:     Patient ID: Donald Gillespie, male    DOB: 07/18/1976, 48 y.o.   MRN: [id number]  Chief Complaint  Patient presents with   Knee Pain    X1 month    HPI .SABRADiscussed the use of AI scribe software for clinical note transcription with the patient, who gave verbal consent to proceed.  History of Present Illness Donald Gillespie is a 48 year old male who presents with left knee pain and IT band syndrome.  Lateral left knee pain - Sharp pain localized to the lateral aspect of the left knee, onset after a run approximately one month ago - Initial pain was dull during the run, became sharp upon cooling down - Soreness extended from the knee upwards the following morning - No recollection of specific injury or trauma - Pain radiates and sometimes shifts side to side - Tenderness to palpation over the lateral knee - No swelling present - Dull pain with ambulation, intensifies with activities such as climbing stairs - Pain is exacerbated at night when the leg is straight, described as throbbing and disruptive to sleep - Pain is manageable during the day but worsens with certain movements  Impact on physical activity - Training for a half marathon, increased running distance from four to ten miles every Saturday over 1.5 months - Completed half marathon, experienced significant pain during the last two miles, required adjustment of gait and cadence - Pain persists despite rest and activity modification - Pain disrupts sleep at night  Prior interventions and symptom management - Physical therapy with three sessions of dry needling - Advised rest for two and a half weeks, refrained from running during this period - Post-race, received an hour-long dry needling session without pain relief - Utilizes massage gun and stretching for symptom management - Anti-inflammatory medications provide mild relief - No use of tramadol  for knee pain due to concern about  masking symptoms.    ROS See HPI.      Objective:    BP 132/70   Pulse 72   Ht 5' 9 (1.753 m)   Wt 199 lb (90.3 kg)   SpO2 99%   BMI 29.39 kg/m  BP Readings from Last 3 Encounters:  06/12/24 132/70  05/10/24 128/66  12/13/23 133/74   Wt Readings from Last 3 Encounters:  06/12/24 199 lb (90.3 kg)  05/10/24 198 lb (89.8 kg)  12/13/23 201 lb 1.3 oz (91.2 kg)      Physical Exam Constitutional:      Appearance: Normal appearance.  Cardiovascular:     Rate and Rhythm: Normal rate.  Pulmonary:     Effort: Pulmonary effort is normal.  Musculoskeletal:     Comments: Left knee:  No swelling, bruising, redness.  Tenderness just under the patella and then over lateral knee and up into IT band No joint laxity Negative McMurrays 5/5 strength   Neurological:     General: No focal deficit present.     Mental Status: He is alert and oriented to person, place, and time.  Psychiatric:        Mood and Affect: Mood normal.          Assessment & Plan:  .SABRACharlie was seen today for knee pain.  Diagnoses and all orders for this visit:  Acute pain of left knee -     DG Knee 4 Views W/Patella Left; Future  It band syndrome, left   Assessment & Plan Left knee iliotibial  band syndrome/left knee pain Chronic left knee iliotibial band syndrome with sharp lateral knee pain exacerbated by running and certain movements.Suspect more than one thing going on with tendinitis of LCL, patellorfemoral syndrome and IT band strain. Mild relief from anti-inflammatories.  - Ordered x-ray of the left knee to rule out structural abnormalities. - Advised use of a knee stabilizing brace to alleviate pressure on the knee. - Instructed to avoid running and high-impact activities. - Continue IT band exercises. - Consider use of topical treatments such as icy hot creams or Voltaren  for pain relief. - Given exercises to target LCL and IT band.  - Use tramadol  as needed for pain management,  especially at night to aid sleep. - If no improvement in symptoms in the next 2-3 weeks consider referral to sports medicine.   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 Differential includes chondromalacia patellae. No meniscal or ligamentous damage observed. - Discussed with patient I do not feel like this is wear most of his pain is coming from so once his lateral knee pain has improved we can work more on this - Ordered x-ray of the left knee to rule out structural abnormalities. - Advised use of a patellar strap to alleviate pressure on the knee. - Instructed to avoid running and high-impact activities. - Continue IT band exercises. - Add exercises for patellofemoral pain syndrome. - Consider use of topical treatments such as icy hot creams or Voltaren  for pain relief. - Use tramadol  as needed for pain management, especially at night to aid sleep.    Donald Lukins, PA-C   "

## 2024-06-12 NOTE — Progress Notes (Signed)
No acute findings on knee xray.

## 2024-06-13 ENCOUNTER — Encounter: Payer: Self-pay | Admitting: Physician Assistant

## 2024-06-13 DIAGNOSIS — M7632 Iliotibial band syndrome, left leg: Secondary | ICD-10-CM | POA: Insufficient documentation

## 2024-06-19 NOTE — Telephone Encounter (Signed)
 I would say lets refer him to Dr. Sharlene in [facility] for further workup since like he still having some pretty significant pain and if he is okay with that please place referral.

## 2024-06-20 NOTE — Telephone Encounter (Signed)
 Spoke with patient. He states he found that the pain was coming form the brace he was using. He has changed the brace and has been pain free for the last two  nights. He declines the referral for now.

## 2024-06-27 ENCOUNTER — Other Ambulatory Visit: Payer: Self-pay | Admitting: Physician Assistant

## 2024-06-27 ENCOUNTER — Telehealth: Payer: Self-pay

## 2024-06-27 MED ORDER — BUDESONIDE-FORMOTEROL FUMARATE 80-4.5 MCG/ACT IN AERO: 2.0000 | INHALATION_SPRAY | Freq: Two times a day (BID) | RESPIRATORY_TRACT | 3 refills | Status: AC

## 2024-06-27 NOTE — Telephone Encounter (Signed)
 Copied from CRM #[id number]. Topic: Clinical - Prescription Issue >> Jun 27, 2024  1:29 PM Joesph B wrote: Reason for CRM: Patient is requesting a new inhaler. His pharmacy states they do not have budesonide  (PULMICORT ) 180 MCG/ACT inhaler. Patient wants a alternative. Please fu with pt. [phone]. He wants someone to call him.

## 2024-06-27 NOTE — Telephone Encounter (Signed)
 Taevion states the pharmacies have a hard time getting the Pulmicort . He would like a different inhaler.

## 2024-06-27 NOTE — Telephone Encounter (Signed)
 Sent symbicort  to replace pulmicort !

## 2024-06-28 NOTE — Telephone Encounter (Signed)
 Patient advised.

## 2024-07-24 ENCOUNTER — Encounter: Payer: Self-pay | Admitting: Physician Assistant

## 2024-08-04 ENCOUNTER — Ambulatory Visit: Payer: Self-pay | Admitting: Family

## 2024-08-04 ENCOUNTER — Telehealth: Payer: Self-pay | Admitting: Physician Assistant

## 2024-08-04 DIAGNOSIS — F439 Reaction to severe stress, unspecified: Secondary | ICD-10-CM

## 2024-08-04 MED ORDER — CLONAZEPAM 0.5 MG PO TABS: 0.5000 mg | ORAL_TABLET | Freq: Every day | ORAL | 0 refills | Status: AC

## 2024-08-04 NOTE — Telephone Encounter (Signed)
 Called [facility] and advised them to let the clinical staff know this message was sent with patient's questions/concerns and they are going to let the staff member know who had just tried to contact the patient.

## 2024-08-04 NOTE — Telephone Encounter (Signed)
 FYI Only or Action Required?: Action required by provider: request for appointment, clinical question for provider, update on patient condition, and pt wants a call back about klonopin  refill, appointment, and getting a counselor.  Patient was last seen in primary care on 06/12/2024 by Antoniette Vermell CROME, PA-C.  Called Nurse Triage reporting Anxiety.  Symptoms began worse yesterday after dealing with insurance.  Interventions attempted: Rest, hydration, or home remedies.  Symptoms are: rapidly worsening.  Triage Disposition: See Physician Within 24 Hours  Patient/caregiver understands and will follow disposition?: Unsure                Copied from CRM [id number]. Topic: Clinical - Red Word Triage >> Aug 04, 2024 10:40 AM Donald Gillespie wrote: Red Word that prompted transfer to Nurse Triage: Patient is trying to get connected with a counselor, states his anxiety is through the roof. Reason for Disposition  Patient sounds very upset or troubled to the triager  Answer Assessment - Initial Assessment Questions Patient has been getting the run around with insurance and trying to get in to see a counselor. Help advocate have to do a whole intake before being able to help him. Patient states he is trying to get a refill of his: Clonazepam  (Klonopin ) 0.5mg  tablet Patient states he has two tablets left that expired 03/2024.  Patient is getting frustrated  Yesterday his anxiety got a lot worse after dealing with Insurance. Patient has been trying to find a counselor and he is unable to find someone who can tell him what his portion of financial responsibility would be. Nobody has been able to confidently tell him about how getting a counselor works financially--patient is very frustrated at this point.  Patient states he wants to crawl in a hole and be left alone. Patient denies any suicidal or hom Patient denies any other symptoms  Patient reached out to the pharmacy for a refill and  they advised patient to reach out to doctor's office and it's at a different location  Patient's pharmacy: [facility] [place], [place]  [address]  Offered patient the address and phone number to the Behavioral [facility]--patient wrote these down.  Patient is advised that it is recommended he be seen by someone in the next day and with it being Friday that Urgent Care is recommended. He states that he will take their information but again he is worried about financial aspects with insurance coverage.  He wants help getting a counselor and wants to know why the Klonopin  cannot be refilled.  There are no openings today at patient's PCP office or any surrounding offices within the region that this RN could schedule patient for at this time.  Patient is advised to call us  back if anything changes or with any further questions/concerns. Patient is advised that if anything worsens to go to the Emergency Room. Patient verbalized understanding.  Protocols used: Anxiety and Panic Attack-A-AH

## 2024-08-04 NOTE — Telephone Encounter (Signed)
 LM for patient to return call to let us  the last he took that medication, as its not on his current med list. Prescribed 05/10/23 with an end date of 12/13/23. Please advise

## 2024-08-04 NOTE — Telephone Encounter (Signed)
 Copied from CRM #[id number]. Topic: Clinical - Medication Refill >> Aug 04, 2024 10:33 AM Willma R wrote: Medication: clonazePAM  (KLONOPIN ) 0.5 MG tablet  Has the patient contacted their pharmacy? Yes, call dr  This is the patient's preferred pharmacy:  [id number] [place], [place] - [address] [place] [address] Phone: [phone] Fax: [phone]  Is this the correct pharmacy for this prescription? Yes  Has the prescription been filled recently? No  Is the patient out of the medication? No  Has the patient been seen for an appointment in the last year OR does the patient have an upcoming appointment? Yes  Can we respond through MyChart? No  Agent: Please be advised that Rx refills may take up to 3 business days. We ask that you follow-up with your pharmacy.

## 2024-08-04 NOTE — Telephone Encounter (Signed)
"  Unable to pend  "

## 2024-08-04 NOTE — Telephone Encounter (Signed)
 If he uses mychart I can respond to him there. I will take note of that about the student.

## 2024-08-04 NOTE — Telephone Encounter (Signed)
 Called Mr. Donald Gillespie per his request to speak with management. The patient expressed frustration with having to communicate through the call center and leaving messages for his provider. He stated that he prefers direct communication and would like his messages to be addressed only by [facility].  He also voiced concerns about provider coverage, noting that he does not prefer seeing other providers when Donald Gillespie is unavailable. Additionally, he expressed dissatisfaction with having a student involved for approximately 90% of his visit, with Donald Gillespie present for only a brief portion. While he stated that the student performed well, he emphasized that his expectation was to see his physician rather than the student.  I informed Donald Gillespie that I would relay his concerns to [facility] and make her aware of his preference to see and communicate exclusively with her moving forward.

## 2024-08-04 NOTE — Telephone Encounter (Addendum)
 Patient returned call and was clearly already frustrated from speaking with [facility]. He wanted to know why no one who he spoke knew what they were doing or why the medication couldn't be refilled. I did advise him that the medication wasn't showing on his med list because it was last prescribed to him in 04/2023 with an end date of 11/2022. He had a long pause and then he stated he only wanted Donald Gillespie to read and respond to his messages because other providers in the office and doesn't know him and is not pleased with the response he got from Zada Palin, NP in regards to his knee. I guaranteed the medication requests was forwarded to Donald Gillespie for review. He then had another long pause after asking him did he have any further questions or concerns. He stated he wanted to speak with the clinic supervisor Donald Gillespie, I advised him that I would have her give him a call ASAP. He voiced his understanding.

## 2024-08-04 NOTE — Telephone Encounter (Signed)
 I sent a refill on klonapin. Follow up on knee pain or he can send my a mychart message.

## 2024-08-07 NOTE — Telephone Encounter (Signed)
 Clonazepam  0.5 shows in patient chart as  refilled by provider on 08/04/2024 to [id number] -  [address] [place], [place] [address] Phone: [phone]
